# Patient Record
Sex: Female | Born: 1980 | Race: White | Hispanic: No | Marital: Married | Smoking: Never smoker
Health system: Southern US, Community
[De-identification: ages and names within clinical notes are randomized; demographics above are authoritative.]

## PROBLEM LIST (undated history)

## (undated) ENCOUNTER — Inpatient Hospital Stay (HOSPITAL_COMMUNITY): Payer: Self-pay

## (undated) DIAGNOSIS — O09219 Supervision of pregnancy with history of pre-term labor, unspecified trimester: Secondary | ICD-10-CM

## (undated) DIAGNOSIS — I10 Essential (primary) hypertension: Secondary | ICD-10-CM

## (undated) DIAGNOSIS — O09212 Supervision of pregnancy with history of pre-term labor, second trimester: Secondary | ICD-10-CM

## (undated) DIAGNOSIS — Z789 Other specified health status: Secondary | ICD-10-CM

## (undated) DIAGNOSIS — O3432 Maternal care for cervical incompetence, second trimester: Secondary | ICD-10-CM

## (undated) DIAGNOSIS — O34219 Maternal care for unspecified type scar from previous cesarean delivery: Secondary | ICD-10-CM

## (undated) HISTORY — PX: NO PAST SURGERIES: SHX2092

---
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
## 2013-04-25 LAB — OB RESULTS CONSOLE HIV ANTIBODY (ROUTINE TESTING): HIV: NONREACTIVE

## 2013-04-25 LAB — OB RESULTS CONSOLE HGB/HCT, BLOOD
HCT: 33 %
Hemoglobin: 11.3 g/dL

## 2013-04-25 LAB — OB RESULTS CONSOLE RUBELLA ANTIBODY, IGM: Rubella: IMMUNE

## 2013-04-25 LAB — OB RESULTS CONSOLE RPR: RPR: NONREACTIVE

## 2013-04-25 LAB — OB RESULTS CONSOLE ANTIBODY SCREEN: ANTIBODY SCREEN: NEGATIVE

## 2013-04-25 LAB — OB RESULTS CONSOLE ABO/RH: RH TYPE: POSITIVE

## 2013-04-25 LAB — OB RESULTS CONSOLE HEPATITIS B SURFACE ANTIGEN: HEP B S AG: NEGATIVE

## 2013-05-01 LAB — OB RESULTS CONSOLE GC/CHLAMYDIA
CHLAMYDIA, DNA PROBE: NEGATIVE
GC PROBE AMP, GENITAL: NEGATIVE

## 2013-08-20 ENCOUNTER — Encounter (HOSPITAL_COMMUNITY): Payer: Self-pay | Admitting: *Deleted

## 2013-08-20 ENCOUNTER — Encounter (HOSPITAL_COMMUNITY): Payer: BC Managed Care – PPO

## 2013-08-20 ENCOUNTER — Inpatient Hospital Stay (HOSPITAL_COMMUNITY)
Admission: AD | Admit: 2013-08-20 | Discharge: 2013-09-03 | DRG: 765 | Disposition: A | Discharge status: Home / Self Care | Payer: BC Managed Care – PPO | Source: Ambulatory Visit | Attending: Obstetrics and Gynecology | Admitting: Obstetrics and Gynecology

## 2013-08-20 DIAGNOSIS — O30009 Twin pregnancy, unspecified number of placenta and unspecified number of amniotic sacs, unspecified trimester: Secondary | ICD-10-CM | POA: Diagnosis present

## 2013-08-20 DIAGNOSIS — O99892 Other specified diseases and conditions complicating childbirth: Secondary | ICD-10-CM | POA: Diagnosis present

## 2013-08-20 DIAGNOSIS — O3090 Multiple gestation, unspecified, unspecified trimester: Secondary | ICD-10-CM | POA: Diagnosis present

## 2013-08-20 DIAGNOSIS — O4290 Premature rupture of membranes, unspecified as to length of time between rupture and onset of labor, unspecified weeks of gestation: Secondary | ICD-10-CM | POA: Diagnosis present

## 2013-08-20 DIAGNOSIS — O3430 Maternal care for cervical incompetence, unspecified trimester: Secondary | ICD-10-CM | POA: Diagnosis present

## 2013-08-20 DIAGNOSIS — Z22330 Carrier of Group B streptococcus: Secondary | ICD-10-CM

## 2013-08-20 DIAGNOSIS — O30049 Twin pregnancy, dichorionic/diamniotic, unspecified trimester: Secondary | ICD-10-CM | POA: Diagnosis not present

## 2013-08-20 DIAGNOSIS — IMO0001 Reserved for inherently not codable concepts without codable children: Secondary | ICD-10-CM

## 2013-08-20 DIAGNOSIS — O1002 Pre-existing essential hypertension complicating childbirth: Secondary | ICD-10-CM | POA: Diagnosis present

## 2013-08-20 DIAGNOSIS — O26879 Cervical shortening, unspecified trimester: Secondary | ICD-10-CM | POA: Diagnosis present

## 2013-08-20 DIAGNOSIS — O9989 Other specified diseases and conditions complicating pregnancy, childbirth and the puerperium: Secondary | ICD-10-CM

## 2013-08-20 DIAGNOSIS — O4700 False labor before 37 completed weeks of gestation, unspecified trimester: Secondary | ICD-10-CM | POA: Diagnosis present

## 2013-08-20 DIAGNOSIS — O329XX0 Maternal care for malpresentation of fetus, unspecified, not applicable or unspecified: Secondary | ICD-10-CM

## 2013-08-20 HISTORY — DX: Essential (primary) hypertension: I10

## 2013-08-20 HISTORY — DX: Other specified health status: Z78.9

## 2013-08-20 LAB — CBC
HCT: 37.1 % (ref 36.0–46.0)
HEMOGLOBIN: 12.3 g/dL (ref 12.0–15.0)
MCH: 31.9 pg (ref 26.0–34.0)
MCHC: 33.2 g/dL (ref 30.0–36.0)
MCV: 96.1 fL (ref 78.0–100.0)
Platelets: 209 10*3/uL (ref 150–400)
RBC: 3.86 MIL/uL — AB (ref 3.87–5.11)
RDW: 13.7 % (ref 11.5–15.5)
WBC: 18.6 10*3/uL — ABNORMAL HIGH (ref 4.0–10.5)

## 2013-08-20 MED ORDER — METHYLDOPA 250 MG PO TABS
250.0000 mg | ORAL_TABLET | Freq: Two times a day (BID) | ORAL | Status: DC
  Administered 2013-08-20 – 2013-08-31 (×22): 250 mg via ORAL
  Filled 2013-08-20 (×24): qty 1

## 2013-08-20 MED ORDER — PRENATAL MULTIVITAMIN CH
1.0000 | ORAL_TABLET | Freq: Two times a day (BID) | ORAL | Status: DC
  Administered 2013-08-20 – 2013-08-31 (×22): 1 via ORAL
  Filled 2013-08-20 (×22): qty 1

## 2013-08-20 MED ORDER — PRENATAL 27-0.8 MG PO TABS
1.0000 | ORAL_TABLET | Freq: Two times a day (BID) | ORAL | Status: DC
Start: 2013-08-20 — End: 2013-08-20
  Filled 2013-08-20 (×2): qty 1

## 2013-08-20 MED ORDER — ACETAMINOPHEN 325 MG PO TABS
650.0000 mg | ORAL_TABLET | ORAL | Status: DC | PRN
  Administered 2013-08-30: 650 mg via ORAL
  Filled 2013-08-20: qty 2

## 2013-08-20 MED ORDER — BETAMETHASONE SOD PHOS & ACET 6 (3-3) MG/ML IJ SUSP
12.0000 mg | Freq: Once | INTRAMUSCULAR | Status: AC
  Administered 2013-08-20: 12 mg via INTRAMUSCULAR
  Filled 2013-08-20: qty 2

## 2013-08-20 MED ORDER — CALCIUM CARBONATE ANTACID 500 MG PO CHEW: 2.0000 | CHEWABLE_TABLET | ORAL | Status: DC | PRN

## 2013-08-20 MED ORDER — MAGNESIUM OXIDE 400 (241.3 MG) MG PO TABS
200.0000 mg | ORAL_TABLET | Freq: Two times a day (BID) | ORAL | Status: DC
  Administered 2013-08-20 – 2013-08-23 (×7): 200 mg via ORAL
  Administered 2013-08-24: 10:00:00 via ORAL
  Administered 2013-08-24 – 2013-08-27 (×6): 200 mg via ORAL
  Administered 2013-08-27 – 2013-08-28 (×2): via ORAL
  Administered 2013-08-28 – 2013-08-30 (×5): 200 mg via ORAL
  Filled 2013-08-20 (×24): qty 0.5

## 2013-08-20 MED ORDER — MAGNESIUM OXIDE 250 MG PO TABS: 1.0000 | ORAL_TABLET | Freq: Two times a day (BID) | ORAL | Status: DC

## 2013-08-20 MED ORDER — NIFEDIPINE 10 MG PO CAPS
10.0000 mg | ORAL_CAPSULE | Freq: Four times a day (QID) | ORAL | Status: DC
  Administered 2013-08-20 – 2013-08-25 (×19): 10 mg via ORAL
  Filled 2013-08-20 (×19): qty 1

## 2013-08-20 MED ORDER — PRENATAL MULTIVITAMIN CH: 1.0000 | ORAL_TABLET | Freq: Every day | ORAL | Status: DC

## 2013-08-20 MED ORDER — NIFEDIPINE 10 MG PO CAPS
20.0000 mg | ORAL_CAPSULE | Freq: Once | ORAL | Status: AC
  Administered 2013-08-20: 20 mg via ORAL
  Filled 2013-08-20: qty 2

## 2013-08-20 MED ORDER — ZOLPIDEM TARTRATE 5 MG PO TABS
5.0000 mg | ORAL_TABLET | Freq: Every evening | ORAL | Status: DC | PRN
  Administered 2013-08-21 – 2013-08-31 (×9): 5 mg via ORAL
  Filled 2013-08-20 (×11): qty 1

## 2013-08-20 NOTE — H&P (Signed)
  OB ADMISSION/ HISTORY & PHYSICAL:  Admission Date: 08/20/2013 11:28 AM  Admit Diagnosis: [redacted] weeks gestation / twins -di-di / threatened preterm birth / short cervical length / chronic hypertension - stable  Nehemiah Settlenna Lori is a 33 y.o. female presenting for admit from the office with shortened cervical length with serial ultrasound this week. NO PTL signs.   Prenatal History: No obstetric history, G1   [facility]  Primary Ob Provider: Marlinda Mikeanya Bailey CNM and Dr Juliene PinaMody Prenatal course complicated by Di-Di twins / chronic hypertension stable on Aldomet / short cervix. No past surgeries on cervix.  Prenatal Labs: ABO, Rh:   A positive Antibody:  negative Rubella:   Immune RPR:   NR HBsAg:   Negative HIV:   NR GC-CHL: negative GTT: not done yet GBS:   not done yet  Medical / Surgical History :  Past medical history: chronic hypertension  Past surgical history: No past surgical history on file.  Family History: No family history on file.   Social History:  has no tobacco, alcohol, and drug history on file.   Allergies: Tetracycline Sulfa based antibiotics   Current Medications at time of admission:  Prenatal vitamin daily Magnesium 250 mg daily Aldomet 250mg  BID  Review of Systems: Active FM  Physical Exam:  VS: 98.3 - 96-18-116/82 General: alert and oriented, appears upset and concerned - tearful over admission Heart: RRR Lungs: Clear lung fields Abdomen: Gravid, soft and non-tender, non-distended / uterus: gravid and non-tender Extremities: no edema  Genitalia / VE:  posterior / soft / closed / thinning @ 60% / vtx / -4                          + LUSD                          + mucus discharge - wet prep few yeast / no BV  FHR: A-140 B-145, NST reactive A and B TOCO: no regular ctx or UI  Assessment: 26.[redacted] weeks gestation Twins - di-di  (cephalic and transverse) Chronic hypertension - stable on Aldomet (was  started prior to pt moving to [place], so didn't change) Threatened PTL / risk for preterm birth Short cervix  Plan:  Admit Inpatient observation and evalaution of uterine activity Completion of BMZ course (5/11, 5/12) NICU and MFM consult in AM.  Reviewed no role of progesterone (may harm) in twins. Will discuss bedrest v/s possible cervical pessary with MFM.        Initiate Magnesium sulphate for neuroprophylaxis if threatened preterm labor.   D/w pt and husband, voice understading, needs to inform her work that she will not be be returning until after delivery.

## 2013-08-20 NOTE — Progress Notes (Signed)
Pt off the monitor after reassurring FHR  

## 2013-08-21 ENCOUNTER — Inpatient Hospital Stay (HOSPITAL_COMMUNITY): Payer: BC Managed Care – PPO

## 2013-08-21 ENCOUNTER — Ambulatory Visit (HOSPITAL_COMMUNITY): Payer: BC Managed Care – PPO

## 2013-08-21 LAB — TYPE AND SCREEN
ABO/RH(D): A POS
ABO/RH(D): A POS
ANTIBODY SCREEN: POSITIVE
Antibody Screen: POSITIVE
DAT, IgG: POSITIVE
UNIT DIVISION: 0
Unit division: 0

## 2013-08-21 LAB — GLUCOSE, CAPILLARY: GLUCOSE-CAPILLARY: 121 mg/dL — AB (ref 70–99)

## 2013-08-21 MED ORDER — FAMOTIDINE 20 MG PO TABS
20.0000 mg | ORAL_TABLET | Freq: Every day | ORAL | Status: DC
  Administered 2013-08-21 – 2013-08-31 (×11): 20 mg via ORAL
  Filled 2013-08-21 (×11): qty 1

## 2013-08-21 MED ORDER — DOCUSATE SODIUM 100 MG PO CAPS
100.0000 mg | ORAL_CAPSULE | Freq: Two times a day (BID) | ORAL | Status: DC
  Administered 2013-08-21 – 2013-08-30 (×12): 100 mg via ORAL
  Filled 2013-08-21 (×19): qty 1

## 2013-08-21 NOTE — Consult Note (Addendum)
[facility]  [facility] Consult       08/21/2013  6:12 PM   I was asked by Dr. Juliene PinaMody to consult on this patient for possible preterm delivery.  I had the pleasure of meeting with Hannah [name] today.  She is a 33 y/o G1P0 who is pregnant with di-di female twins and is at 3926 and 4/[redacted] weeks gestation.  She was recently diagnosed with a shortned cervix and was [facility] for further observation.  She is to have an MFM consult today to help determine further management, but she is not actively laboring.  Both twins were well grown at the last growth scan and > 2 pounds according to [name].  She received BMZ on 5/11 and 5/12.  Her pregnancy has otherwise been uncomplicated.  [place] is married, [place], and is a middle Engineer, siteschool teacher.    I explained that the neonatal intensive care team would be present for the delivery and outlined the likely delivery room course for this baby including routine resuscitation and NRP-guided approaches to the treatment of respiratory distress. We discussed other common problems associated with prematurity including respiratory distress syndrome/CLD, apnea, feeding issues, temperature regulation, and infection risk.  We discussed IVH/PVL, ROP, and NEC and that these are complications associated with prematurity, and if her pregnancy were to last beyond [redacted] weeks gestation, these would be uncommon.    We discussed the average length of stay but I noted that the actual LOS would depend on the severity of problems encountered and response to treatments.  We discussed visitation policies and the resources available while her child is in the hospital.  We discussed the importance of good nutrition and various methods of providing nutrition (parenteral hyperalimentation, gavage feedings and/or oral feeding). We discussed the benefits of human milk. I encouraged breast feeding and pumping soon after  birth and outlined resources that are available to support breast feeding.  [name] plans to provide breast milk for her infants.    Thank you for involving us in the care of this patient. A member of our team will be available should the family have additional questions.  Time for consultation approximately 40 minutes.   _____________________ Electronically Signed By: Maryan CharLindsey Kaniya Trueheart, MD Neonatologist

## 2013-08-21 NOTE — Progress Notes (Signed)
Chief complaint: Cervical incompetence  Subjective: Patient notes active fetal movement x2, no vaginal bleeding, no leakage of fluid, no contractions. Patient tolerating bedrest with bathroom privileges appropriately. Patient interested in plan and maternal-fetal medicine recommendations. On complicated medical and prenatal history reviewed with patient. No prior cervical surgeries, spontaneous twins, in no medical conditions other than mild chronic hypertension.  Patient started on Procardia last night 10 mg every 6 hours. Patient has received betamethasone prophylaxis.  Objective: Filed Vitals:   08/21/13 0814  BP: 120/76  Pulse: 97  Temp: 98.6 F (37 C)  Resp: 18   General: Lying in bed, in no distress cardiovascular: Regular rate and rhythm Pulmonary: Clear to auscultation bilaterally Abdomen: Gravid, nontender, nondistended Lower extremity: Nontender, no edema GU: Deferred  Ultrasound: Formal report pending. Images reviewed closed cervical length of 0.5 cm with U-shaped funneling. Baby A vertex  Toco: slight irritibility, no contractions  Assessment and plan: 33 year old G1 at 26 weeks and 4 days with cervical funneling suggestive of cervical incompetence in the absence of cervical risk factors. Twin pregnancy  - Preterm cervical changes. Recommend continued bed rest at this point discussed with patient unclear if and when she will be able to be discharged from the hospital. Prophylactic measure such as betamethasone, bedrest and magnesium sulfate should she develop preterm labor discussed with patient. Again discussed with patient recommendation against cerclage and recommendation against progesterone. Pessary is one option which we will consider and have asked maternal-fetal medicine to consult on this issue. Procardia to decrease uterine irritability.  Chronic hypertension. Continue Aldomet  Prophylaxis: Start Colace and Pepcid continue prenatal vitamin and start SCDs  Mode  of delivery: Likely C-section given prematurity  Disposition: Inpatient

## 2013-08-22 NOTE — Progress Notes (Signed)
Assisted by CNA with washing her hair

## 2013-08-22 NOTE — Progress Notes (Addendum)
ANTEPARTUM NOTE - Hospital Day 2  Subjective: Reports feeling well but teary Tolerating po intake / no nausea / no vomiting  BM today / Voiding QS No bleeding or LOF Fetal activity x2 Feeling occasional "bubbling" in lower abdomen, denies tightening or cramping      Objective: Vital signs: VS: Blood pressure 106/68, pulse 102, temperature 98.1 F (36.7 C), temperature source Oral, resp. rate 18, height 5\' 5"  (1.651 m), weight 71.714 kg (158 lb 1.6 oz), last menstrual period 02/16/2013.   Physical exam:        General appearance/behavior: teary, alert and oriented x3       Heart: RRR       Lungs CTA bilaterally       Abdomen: soft, non-tender, gravid       Extremities: no edema, Homans negative, SCDs placed       GU: deferred  Fetal Assessment:  FHR A: 145 bpm, moderate variability, +accels, no decels FHR B: 150 bpm, moderate variability, +accels, rare variable (audible distinction of 2 FHR per RN) TOCO: irregular irritability  Assessment: 26.[redacted] weeks gestation, didi twins Cervical incompetence with funneling NST reactive x2 Chronic HTN-stable S/p BMZ x2 doses  Plan:  Continue BR, Procardia, Aldomet, and fetal surveillance.  Place pessary per MFM.  Dr Juliene PinaMody updated with patient status / plan of care  Lawernce PittsMelanie N Bhambri MSN, CNM 08/22/2013, 12:21 PM  08/22/13 Pt seen at 5 pm. MD note- pt seen and examined, evaluated, agree with above note and plan. NST reactive x 2 for both twins.  Pessary reviewed and patient was counseled by MFM, wants to proceed, we are awaiting Pessary placement. Discharge planning not until we reviewed cervix stability and progress. Pt understands and agrees.  V.Karin Pinedo, MD

## 2013-08-22 NOTE — Progress Notes (Signed)
Monitors applied

## 2013-08-22 NOTE — Progress Notes (Signed)
Cardio off after reassurring FHR

## 2013-08-23 NOTE — Progress Notes (Signed)
This was my initial visit with Hannah [name] who reported that she is coping better today than she was yesterday.  We spoke about how to make this time of waiting as meaningful as possible and we talked about meditation and relaxation techniques to help her cope with her fears and worries.    We will continue to follow up with her as we are able, but please also page as needs arise.  Centex CorporationChaplain Katy Ching Rabideau Pager, [phone] 2:20 PM   08/23/13 1400  Clinical Encounter Type  Visited With Patient  Visit Type Spiritual support;Initial

## 2013-08-23 NOTE — Progress Notes (Signed)
Patient on bedpan to empty bladder.

## 2013-08-23 NOTE — Progress Notes (Signed)
ANTEPARTUM NOTE - Hospital Day 3 Subjective:  Reports feeling well, would like to know when pessary placement will be done.  Tolerating po intake / no nausea / no vomiting  BM today / Voiding QS  No bleeding or LOF  Fetal activity x2  Feeling occasional mild tightening in lower abdo.  Objective:  Vital signs:  VS: Blood pressure 115/68, pulse 88, temperature 98.5 F, temperature source Oral, resp. rate 18, height 5\' 5"  (1.651 m), weight 71.714 kg (158 lb 1.6 oz), last menstrual period 02/16/2013.  Physical exam:  General appearance/behavior: teary, alert and oriented x3  Abdomen: soft, non-tender, gravid  Extremities: no edema, Homans negative, SCDs placed  GU: deferred  Fetal Assessment:  FHR A: 140 bpm, moderate variability, +accels, no decels  FHR B: 150 bpm, moderate variability, +accels, no decels TOCO: rare irritability  Assessment:  26 6/[redacted] weeks gestation, didi twins  Cervical incompetence with funneling  NST reactive x2  Chronic HTN-stable  S/p BMZ x2 doses  Plan:  Continue BR, Procardia, Aldomet, and fetal surveillance.  Place pessary per MFM today.  MFM called to obtain information about estimated time of placement.  Dr Juliene PinaMody updated with patient status / plan of care  Genia DelMarie-Lyne Birda Didonato MD  08/23/2013 at 10:54 am

## 2013-08-24 LAB — SAMPLE TO BLOOD BANK

## 2013-08-24 NOTE — Progress Notes (Signed)
ANTEPARTUM NOTE - Hospital Day 4 Di-Di spontaneous twins 27 wks with cervical incompetence, cervical funneling  Subjective:  Reports feeling well, cramping and pelvic pressure symptoms are entirely resolved. No bleeding or LOF or increased vag discharge. Fetal activity x2. Normal bowel movements. Saw Chaplain yesterday..   Objective:  Vital signs: BP 109/73  Pulse 94  Temp(Src) 98.2 F (36.8 C) (Oral)  Resp 16  Ht 5\' 5"  (1.651 m)  Wt 158 lb 1.6 oz (71.714 kg)  BMI 26.31 kg/m2  LMP 02/16/2013  General :  alert and oriented x3, much more calm and not as teary.  Abdomen: soft, non-tender, gravid, relaxed, no UCs noted.  Extremities: no edema, Homans negative, SCDs placed  GU: sterile speculum exam noted normal vaginal discharge and no bleeding or mucus. Digital exam noted cervical length at 1-1.5 cm on external cervical exam only, lower segment distention felt but fetal parts above pelvic inlet.   Fetal Assessment:  FHR A: 140 bpm, moderate variability, +accels, no decels - reactive FHR B: 150 bpm, moderate variability, +accels, no decels - reactive TOCO: none  Assessment:  [redacted] weeks gestation, didi twins  Cervical incompetence with funneling  NST reactive x2  Chronic HTN-stable S/p BMZ x2 doses   Plan:  Continue BR, Procardia (10mg  qid), Aldomet (250mg  bid), and fetal surveillance.  Pessary ordered since not in stock per MFM. Will reassess if pessary v/s expectant management once pessary available depending on gestational age at that point.   Hilary Hertz-V.Modupe Shampine, MD

## 2013-08-25 MED ORDER — NIFEDIPINE 10 MG PO CAPS
10.0000 mg | ORAL_CAPSULE | Freq: Once | ORAL | Status: AC
  Administered 2013-08-25: 10 mg via ORAL
  Filled 2013-08-25: qty 1

## 2013-08-25 MED ORDER — NIFEDIPINE 10 MG PO CAPS
10.0000 mg | ORAL_CAPSULE | ORAL | Status: DC
  Administered 2013-08-25 – 2013-08-27 (×13): 10 mg via ORAL
  Filled 2013-08-25 (×13): qty 1

## 2013-08-25 NOTE — Progress Notes (Signed)
Pt off the monitor after reassuring FHR  

## 2013-08-25 NOTE — Progress Notes (Signed)
ANTEPARTUM NOTE - Hospital Day 5 Di-Di spontaneous twins 27.1 wks with cervical incompetence, cervical funneling CHTN, stable BTMZ 5/11, 5/12  Subjective:  Reports feeling well, but noted slight lower uterine cramping last night. No vag bleeding or pressure or fluid or increased vag discharge. Fetal activity x2.    Objective:  Vital signs: BP 119/76  Pulse 94  Temp(Src) 98.2 F (36.8 C) (Oral)  Resp 18  Ht 5\' 5"  (1.651 m)  Wt 158 lb 1.6 oz (71.714 kg)  BMI 26.31 kg/m2  LMP 02/16/2013   General :  A&O x3, comfortable in supine position  Abdomen: soft, non-tender, gravid, relaxed, no UCs noted.  Extremities: no edema, Homans negative, SCDs placed  GU: deferred  Fetal Assessment:  FHR A: 140s/ moderate variability, +accels, no decels - reactive FHR B: 140 s/ moderate variability, +accels, no decels - reactive TOCO: some irritability noted  Assessment:  27.[redacted] weeks gestation, didi twins  Cervical incompetence with funneling  NST reactive x2  Chronic HTN-stable S/p BMZ x2 doses   Plan:  Continue strict bedrest. Procardia - INCREASE to 10mg  every 4 hrs.  Aldomet (250mg  bid), and fetal surveillance.  Pessary from MFM awaited. Ordered through MFM dept here. Will reassess if pessary v/s expectant management once pessary available depending on gestational age at that point.   Hilary Hertz-V.Askia Hazelip, MD

## 2013-08-25 NOTE — Progress Notes (Signed)
Pt off the monitor after reassurrinng FHR

## 2013-08-26 NOTE — Progress Notes (Signed)
Ur chart review completed.  

## 2013-08-26 NOTE — Progress Notes (Signed)
Antenatal Nutrition Assessment:  Currently  27 4/[redacted] weeks gestation, with incompetent cervix, twins  Height  65 "  Weight 164 lbs  pre-pregnancy weight 145 lbs .  Pre-pregnancy  BMI 24.2  IBW 125 lbs Total weight gain 19.lbs Weight gain goals 37-54 lbs Estimated needs: 2000-2100 kcal/day, 85-95 grams protein/day, 2.3 liters fluid/day  Regular diet tolerated well, appetite good. Pt has been provide retail and snack menus. Is able to sit up to eat. Reports no concerns for GI motility Current diet prescription will provide for increased needs.  No abnormal nutrition related labs  Nutrition Dx: Increased nutrient needs r/t pregnancy and fetal growth requirements aeb [redacted] weeks gestation.  No educational needs assessed at this time.  Elisabeth CaraKatherine Deitrick Ferreri M.Odis LusterEd. R.D. LDN Neonatal Nutrition Support Specialist Pager [phone]

## 2013-08-26 NOTE — Progress Notes (Addendum)
Patient ID: Hannah [name], female   DOB: 01/30/1981, 33 y.o.   MRN: [id number] Cervical pessary ordered through [facility] office is here, showed to MFM, is appropriate. Will have pt see Dr Harlon FlorWhitaker on 5/19 am for cervical length sono and possible pessary placement if still indicated. Pt advised, agrees.  GBS needs tested, ordered. Glucola and RPR at 28 wks (09/02/16)  V.Juliene PinaMody, MD

## 2013-08-26 NOTE — Progress Notes (Signed)
S: Twins PTL Cervical insufficiency Di./DI @ 27 2/7 weeks BMZ complete  No complaints. Anxious regarding back order pessary (+) FM x 2 occ cramping  VS BP 104/70 98.3  Lungs clear to A Cor RRR Abd: gravid nontender Pad none Extr no edema or calf tenderness  Tracing baseline 145 ( A)/ 145 (B) (+) accels occ ctx (+) uterine irritability  IMP: Twins @ 27 2/7 weeks  Di/DI Cervical insufficiency P) cont inpt. Await pessary ? GBS cx

## 2013-08-27 ENCOUNTER — Inpatient Hospital Stay (HOSPITAL_COMMUNITY): Payer: BC Managed Care – PPO

## 2013-08-27 ENCOUNTER — Ambulatory Visit (HOSPITAL_COMMUNITY)
Admit: 2013-08-27 | Discharge: 2013-08-27 | Disposition: A | Discharge status: Home / Self Care | Payer: BC Managed Care – PPO | Attending: Obstetrics & Gynecology | Admitting: Obstetrics & Gynecology

## 2013-08-27 ENCOUNTER — Other Ambulatory Visit (HOSPITAL_COMMUNITY): Payer: BC Managed Care – PPO

## 2013-08-27 LAB — TYPE AND SCREEN
ABO/RH(D): A POS
Antibody Screen: POSITIVE
DAT, IGG: POSITIVE
Unit division: 0
Unit division: 0

## 2013-08-27 MED ORDER — MAGNESIUM SULFATE 40 G IN LACTATED RINGERS - SIMPLE
2.0000 g/h | INTRAVENOUS | Status: DC
  Filled 2013-08-27: qty 500

## 2013-08-27 MED ORDER — MAGNESIUM SULFATE BOLUS VIA INFUSION
4.0000 g | Freq: Once | INTRAVENOUS | Status: AC
  Administered 2013-08-27: 4 g via INTRAVENOUS
  Filled 2013-08-27: qty 500

## 2013-08-27 MED ORDER — INDOMETHACIN 25 MG PO CAPS
25.0000 mg | ORAL_CAPSULE | Freq: Four times a day (QID) | ORAL | Status: AC
  Administered 2013-08-27 – 2013-08-29 (×8): 25 mg via ORAL
  Filled 2013-08-27 (×8): qty 1

## 2013-08-27 MED ORDER — LACTATED RINGERS IV SOLN
INTRAVENOUS | Status: DC
  Administered 2013-08-27 – 2013-08-31 (×8): via INTRAVENOUS

## 2013-08-27 NOTE — Consult Note (Signed)
Maternal Fetal Medicine Consultation  Requesting Provider(s): Shea EvansVaishali Mody, MD  Reason for consultation: Shortened cervix, ? Candidate for pessary  HPI: Hannah [name] is a 33 yo G1P0 currently at 4938w3d with DC/DA twin gestation who is currently admitted for shortened cervix. The patient reports that a shortened cervix was noted on a routine clinic ultrasound and that she has otherwise been asymptomatic.  On previous exam on 5/13 was noted to have a cervical length of 7 mm with funneling almost to the external os. The completed a course of betamethasone on 5/11 and 5/12 and has been hospitalized on bedrest since that time.  She reports some "uterine irritability" but no cramping or uterine contractions.  She denies vaginal bleeding or leakage of fluid.  The patient reports a history of chronic hypertension and is currently on Aldomet.  Additionally, she was recently started on procardia for preterm contractions.  She is otherwise without complaints.  Both fetuses are active.  OB History: OB History   Grav Para Term Preterm Abortions TAB SAB Ect Mult Living   1               PMH:  Past Medical History  Diagnosis Date  . Medical history non-contributory   . Hypertension     PSH:  Past Surgical History  Procedure Laterality Date  . No past surgeries    . Mandible fracture surgery Bilateral 2000   Meds:  Current Facility-Administered Medications on File Prior to Encounter  Medication Dose Route Frequency Provider Last Rate Last Dose  . acetaminophen (TYLENOL) tablet 650 mg  650 mg Oral Q4H PRN Marlinda Mikeanya Bailey, CNM      . calcium carbonate (TUMS - dosed in mg elemental calcium) chewable tablet 400 mg of elemental calcium  2 tablet Oral Q4H PRN Marlinda Mikeanya Bailey, CNM      . docusate sodium (COLACE) capsule 100 mg  100 mg Oral BID Kelly A. Ernestina PennaFogleman, MD   100 mg at 08/26/13 2215  . famotidine (PEPCID) tablet 20 mg  20 mg Oral Daily Kelly A. Ernestina PennaFogleman, MD   20 mg at 08/27/13 1034  . lactated  ringers infusion   Intravenous Continuous Robley FriesVaishali R Mody, MD      . magnesium bolus via infusion 4 g  4 g Intravenous Once Robley FriesVaishali R Mody, MD      . magnesium oxide (MAG-OX) tablet 200 mg  200 mg Oral BID Robley FriesVaishali R Mody, MD      . magnesium sulfate 40 grams in LR 500 mL OB infusion  2 g/hr Intravenous Titrated Robley FriesVaishali R Mody, MD      . methyldopa (ALDOMET) tablet 250 mg  250 mg Oral BID Marlinda Mikeanya Bailey, CNM   250 mg at 08/27/13 1034  . NIFEdipine (PROCARDIA) capsule 10 mg  10 mg Oral 6 times per day Robley FriesVaishali R Mody, MD   10 mg at 08/27/13 0734  . prenatal multivitamin tablet 1 tablet  1 tablet Oral BID Robley FriesVaishali R Mody, MD   1 tablet at 08/27/13 1034  . zolpidem (AMBIEN) tablet 5 mg  5 mg Oral QHS PRN Marlinda Mikeanya Bailey, CNM   5 mg at 08/26/13 0344   Current Outpatient Prescriptions on File Prior to Encounter  Medication Sig Dispense Refill  . Magnesium Oxide 250 MG TABS Take 1 tablet by mouth 2 (two) times daily.      . methyldopa (ALDOMET) 250 MG tablet Take 250 mg by mouth 2 (two) times daily.      . Prenatal Vit-Fe Fumarate-FA (  MULTIVITAMIN-PRENATAL) 27-0.8 MG TABS tablet Take 1 tablet by mouth 2 (two) times daily.       Allergies:  Allergies  Allergen Reactions  . Tetracyclines & Related Other (See Comments)    Severe stomach cramps  . Sulfa Antibiotics Rash    After sun exposure   FH: Soc:  History   Social History  . Marital Status: Married    Spouse Name: N/A    Number of Children: N/A  . Years of Education: N/A   Occupational History  . Not on file.   Social History Main Topics  . Smoking status: Never Smoker   . Smokeless tobacco: Not on file  . Alcohol Use: Yes     Comment: occassioanl social drinker, not since pregnant  . Drug Use: No  . Sexual Activity: Not Currently   Other Topics Concern  . Not on file   Social History Narrative  . No narrative on file    Review of Systems: no vaginal bleeding or cramping/contractions, no LOF, no nausea/vomiting. All other  systems reviewed and are negative.  PNL: A -- -- Negative -- Nonreactive -- (01/15 0000)   PE:  GEN: well-appearing female ABD: gravid, NT Speculum exam - fetal membranes ballooning into the vagina   Ultrasound: DC/DA twin gestation at 10381w3d Limited ultrasound performed for cervical length Twin A (presenting twin) - cephalic Twin B - transverse with fetal head on the maternal left  TVUS - the cervix is open with membranes visible in the vagina  Ultrasound findings were discussed with the patient.  Based on ultrasound and physical exam findings, she is not a candidate for pessary and at significant risk for PROM.  Labs: CBC    Component Value Date/Time   WBC 18.6* 08/20/2013 2105   RBC 3.86* 08/20/2013 2105   HGB 12.3 08/20/2013 2105   HGB 11.3 04/25/2013   HCT 37.1 08/20/2013 2105   HCT 33 04/25/2013   PLT 209 08/20/2013 2105   MCV 96.1 08/20/2013 2105   MCH 31.9 08/20/2013 2105   MCHC 33.2 08/20/2013 2105   RDW 13.7 08/20/2013 2105     A/P: 1) DC/DA twin gestation at 3481w3d         2) Shortened cervix - now open with fetal membranes into the vagina (hour-glassing membranes); not a candidate for trial of pessary. Completed betamethasone on 5/11 and 5/12         3) Chronic hypertension - well controlled on Aldomet/ Procardia  Recommendations: 1) Hour-glassing membranes now noted - this is a significant clinical change, would recommend a 12 hr course of Magnesium sulfate for neuroprotection.  Fortunately, the presenting twin is cephalic at this time.  I would anticipate that the patient will likely experience PROM within the next several days.  If this occurs, would recommend a course of latency antibiotics and continued in-house observation with at least daily fetal strips. 2) There is likely no benefit for continued cervical length surveillance.  Would recommend serial growth ultrasounds while hospitalized and limited ultrasounds as clinically indicated to check fetal  presentation.  If Twin B remains in a non-cephalic presentation, would recommend cesarean delivery given anticipated fetal weights at this gestation.  Findings and recommendations were discussed with Dr. Juliene PinaMody.   Thank you for the opportunity to be a part of the care of Hannah [name]. Please contact our office if we can be of further assistance.   I spent approximately 30 minutes with this patient with over 50% of time spent in  face-to-face counseling.  Alpha Gula, MD Maternal Fetal Medicine

## 2013-08-27 NOTE — Progress Notes (Signed)
S: Twins PTL Cervical insufficiency Di./DI @ 27 3/7 weeks BMZ complete  No complaints. (+) FM x 2 occ cramping, no bleeding/leaking/ increased vaginal discharge  VS BP 104/70 98.3  Lungs clear to A CV RRR Abd: gravid nontender Pad no discharge Extr no edema or calf tenderness  Tracing baseline 145 ( A)/ 145 (B) (+) accels occ ctx  IMP: Twins @ 27 3/7 weeks  Di/DI Cervical insufficiency P) Pessary today.  GBS cx ordered.       1hr GTT, RPR ordered for 28 wks.

## 2013-08-28 LAB — CULTURE, BETA STREP (GROUP B ONLY)

## 2013-08-28 LAB — OB RESULTS CONSOLE GBS
GBS: POSITIVE
GBS: POSITIVE

## 2013-08-28 NOTE — Progress Notes (Signed)
S: Twins  PTL  Cervical insufficiency  Di./DI @ 27 4/7 weeks  MgSO4 Neuro-protection/Indocin. BMZ complete  No complaints. (+) FM x 2  occ cramping, no bleeding/leaking/ increased vaginal discharge  VS BP 118/70, Pulse 86, RR 18, Temp 98.2 Lungs clear to A  CV RRR  Abd: gravid nontender  Pad no discharge  Extr no edema or calf tenderness  Tracing baseline 145 ( A)/ 145 (B) (+) accels  occ ctx  IMP: Twins @ 27 4/7 weeks.  Seen by MFM yesterday: Hour-glassing membranes, not a candidate for pessary.  Di/DI  Cervical insufficiency  P) Keep in Trendelenburg.  MgSO4 Neuroprotection x 24 hrs finishing today at noon.  On Indocin x 48 hrs.  NST qshift.   GBS cx ordered.  1hr GTT, RPR ordered for 28 wks.   Genia DelMarie-Lyne Henya Aguallo MD  08/28/2013 at 9:11 am

## 2013-08-28 NOTE — Progress Notes (Signed)
Hannah [name] was in much better spirits this afternoon.  She is adjusting to her new positioning and looking forward to some visits from friends this afternoon.  She was very appreciative of the support.  [employer] CorporationChaplain Katy Emrey Thornley Pager, [phone] 3:01 PM   08/28/13 1400  Clinical Encounter Type  Visited With Patient  Visit Type Follow-up

## 2013-08-28 NOTE — Progress Notes (Signed)
Chaplain at bedside for patient support.

## 2013-08-28 NOTE — Progress Notes (Signed)
I received a referral from pt's RN.  Tobi Bastosnna learned yesterday that she is closer to delivery and today, she has had more restrictions on her position.  She was frustrated with her limited independence and scared for her babies.  We spent some time doing some relaxation techniques as well as some time arranging her pillows and things to optimize her comfort and access to what she most needs while she is in bed.    She was feeling somewhat better when I left and she was grateful for the additional support.  [employer] CorporationChaplain Katy Equan Cogbill Pager, [phone] 11:30 a.m.

## 2013-08-29 MED ORDER — AZITHROMYCIN 1 G PO PACK
1.0000 g | PACK | Freq: Once | ORAL | Status: AC
  Administered 2013-08-29: 1 g via ORAL
  Filled 2013-08-29: qty 1

## 2013-08-29 MED ORDER — AMOXICILLIN 500 MG PO CAPS
500.0000 mg | ORAL_CAPSULE | Freq: Three times a day (TID) | ORAL | Status: DC
  Administered 2013-08-31: 500 mg via ORAL
  Filled 2013-08-29 (×4): qty 1

## 2013-08-29 MED ORDER — NIFEDIPINE 10 MG PO CAPS
20.0000 mg | ORAL_CAPSULE | Freq: Four times a day (QID) | ORAL | Status: DC
  Administered 2013-08-29 – 2013-08-30 (×2): 20 mg via ORAL
  Filled 2013-08-29 (×2): qty 2

## 2013-08-29 MED ORDER — SODIUM CHLORIDE 0.9 % IV SOLN
2.0000 g | Freq: Four times a day (QID) | INTRAVENOUS | Status: AC
  Administered 2013-08-29 – 2013-08-31 (×8): 2 g via INTRAVENOUS
  Filled 2013-08-29 (×8): qty 2000

## 2013-08-29 NOTE — Progress Notes (Signed)
Pt off the monitor after reassurring FHR  

## 2013-08-29 NOTE — Progress Notes (Signed)
Pt off the monitor after reassuring FHR  

## 2013-08-29 NOTE — Progress Notes (Signed)
08/29/13 1520  Clinical Encounter Type  Visited With Patient  Visit Type Follow-up;Spiritual support;Social support  Referral From Chaplain;Nurse  Spiritual Encounters  Spiritual Needs Emotional   Hannah [name] was in good spirits on this follow-up visit.  She describes herself as a people person who really wants support, and she was able to take in and process affirmation.  Per Hannah BastosAnna, she would like to have a chaplain called when she goes into labor--for pt/family support as context allows.  We talked about her coping tools and how they are "transferable skills" for parenting, as well as ways that [place] can support her and her family [facility]  Her dad is flying in from [place] tomorrow.  (Her mom died when she was a teenager.)  will follow, but please page as needs arise and/or pt's situation changes significantly.  Thank you!  [address]Chaplain Jericho Cieslik Montclair State UniversityLundeen, South DakotaMDiv [phone]

## 2013-08-29 NOTE — Progress Notes (Signed)
  Cervical insufficiency with hourglassing membranes Di./DI @ 27 5/7 weeks  MgSO4 Neuro-protection/Indocin.  S:  No complaints. (+) FM x 2  occ cramping, no bleeding/leaking/ increased vaginal discharge  No contractions reported. No CP or SOB  O: BP 118/78  Pulse 92  Temp(Src) 98 F (36.7 C) (Oral)  Resp 18  Ht 5\' 5"  (1.651 m)  Wt 74.39 kg (164 lb)  BMI 27.29 kg/m2  LMP 02/16/2013   WDWN WF NAD Lungs clear to A  CV RRR  Abd: gravid nontender  Pad no discharge  Extr no edema or calf tenderness  Neuro: non focal Skin: intact  Tracing baseline 145 ( A)/ 145 (B) (+) accels  occ ctx  Reactive NST x 3  IMP: Di/DI twins with concordant growth(5/11) Cervical insufficiency with hourglassing membranes. BMZ complete. GBS positive CHTN stable on Aldomet  P) Keep in Trendelenburg.  MgSO4 Neuroprotection x 24 hrs finished On Indocin x 48 hrs-dc today NST qshift.   .  1hr GTT, RPR ordered for 28 wks.  Given high risk of infection due to membrane status and GBS positive will treat as PPROM (discussed with MFM)

## 2013-08-30 LAB — TYPE AND SCREEN
ABO/RH(D): A POS
ANTIBODY SCREEN: POSITIVE
DAT, IGG: POSITIVE
UNIT DIVISION: 0
Unit division: 0

## 2013-08-30 LAB — URINALYSIS, ROUTINE W REFLEX MICROSCOPIC
BILIRUBIN URINE: NEGATIVE
Glucose, UA: NEGATIVE mg/dL
HGB URINE DIPSTICK: NEGATIVE
Ketones, ur: NEGATIVE mg/dL
Leukocytes, UA: NEGATIVE
Nitrite: NEGATIVE
Protein, ur: NEGATIVE mg/dL
Specific Gravity, Urine: 1.01 (ref 1.005–1.030)
UROBILINOGEN UA: 0.2 mg/dL (ref 0.0–1.0)
pH: 7.5 (ref 5.0–8.0)

## 2013-08-30 MED ORDER — LACTATED RINGERS IV BOLUS (SEPSIS)
500.0000 mL | Freq: Once | INTRAVENOUS | Status: AC
Start: 2013-08-30 — End: 2013-08-30
  Administered 2013-08-30: 500 mL via INTRAVENOUS

## 2013-08-30 MED ORDER — NIFEDIPINE 10 MG PO CAPS
10.0000 mg | ORAL_CAPSULE | ORAL | Status: DC | PRN
  Administered 2013-08-30: 10 mg via ORAL
  Filled 2013-08-30: qty 1

## 2013-08-30 MED ORDER — NIFEDIPINE 10 MG PO CAPS
10.0000 mg | ORAL_CAPSULE | ORAL | Status: DC
  Administered 2013-08-30 – 2013-08-31 (×5): 10 mg via ORAL
  Filled 2013-08-30 (×5): qty 1

## 2013-08-30 MED ORDER — METOCLOPRAMIDE HCL 5 MG/ML IJ SOLN
10.0000 mg | Freq: Once | INTRAMUSCULAR | Status: AC
  Administered 2013-08-30: 10 mg via INTRAVENOUS
  Filled 2013-08-30: qty 2

## 2013-08-30 NOTE — Progress Notes (Signed)
Hannah [name] was in good spirits when I visited with her although she reported that she'd had a rough night.  She was grateful to have her dad with her and grateful to be going into a three day weekend when her husband will have time off from work and will be able to be with her.  We talked some about what to expect from the NICU and discussed the support from [employer] and the support of fellow parents.  She was grateful for the visit and we will continue to follow up with her.  She is very receptive to care and encouragement and it seems to be helpful to her.  [address] [place] Pager, [phone] 1:06 PM   08/30/13 1300  Clinical Encounter Type  Visited With Patient  Visit Type Follow-up;Spiritual support

## 2013-08-30 NOTE — Progress Notes (Signed)
MD updated on pt c/o h/a and N/V approx 400cc emesis.  Reviewed with MD BP'.  Orders received for LR bolus and Reglan.

## 2013-08-30 NOTE — Progress Notes (Signed)
Pt off the cardio after reassurring FHR

## 2013-08-30 NOTE — Progress Notes (Signed)
Called to room. Patient unable to sleep in trandelenburg and raised to up level. She is feeling dizzy and HA. Patient also requested Remus Loffler which was given.

## 2013-08-30 NOTE — Progress Notes (Signed)
  Cervical insufficiency with hourglassing membranes Di./DI @ 27 6/7 weeks s/p BMZ/ MgSO4 Neuro-protection/Indocin. Resumed procardia 5/21, started antibiotic prophylaxis 5/21  S:  Patient awoke this morning with headache and emesis. Patient thinks this may have been related to steep Trendelenburg position and resuming Procardia. Patient still mild and bleeding nauseous this morning though does feel hungry. Mild headache. No vision changes. (+) FM x 2  occ cramping, no bleeding/leaking/ increased vaginal discharge  No contractions reported. No CP or SOB  O: BP 121/71  Pulse 97  Temp(Src) 98.1 F (36.7 C) (Oral)  Resp 18  Ht 5\' 5"  (1.651 m)  Wt 74.39 kg (164 lb)  BMI 27.29 kg/m2  LMP 02/16/2013    Lungs clear to A  CV RRR  Abd: gravid nontender  GU deferred  Extr no edema or calf tenderness  Neuro: non focal Skin: intact  Tracing baseline 145 ( A)/ 145 (B) (+) accels  occ ctx  Reactive NST x 3  IMP: Di/DI twins with concordant growth(5/11) Cervical insufficiency with hourglassing membranes. BMZ complete. GBS positive CHTN stable on Aldomet  P) Keep in Trendelenburg.  S/p MgSO4 Neuroprotection, betamethasone, Indocin  Per patient's preference we have resumed Procardia  NST qshift.   .  1hr GTT, RPR ordered for 28 wks. Will repeat growth ultrasound 2 weeks from the last, due 5/25  Given high risk of infection due to membrane status and GBS positive will treat as PPROM (discussed with MFM). Currently day 2 of antibiotics   Hannah [name] A. Lihanna Biever 08/30/2013 11:25 AM

## 2013-08-31 ENCOUNTER — Inpatient Hospital Stay (HOSPITAL_COMMUNITY): Payer: BC Managed Care – PPO | Admitting: Anesthesiology

## 2013-08-31 ENCOUNTER — Encounter (HOSPITAL_COMMUNITY): Payer: Self-pay | Admitting: Anesthesiology

## 2013-08-31 ENCOUNTER — Encounter (HOSPITAL_COMMUNITY): Payer: BC Managed Care – PPO | Admitting: Anesthesiology

## 2013-08-31 ENCOUNTER — Inpatient Hospital Stay (HOSPITAL_COMMUNITY): Payer: BC Managed Care – PPO

## 2013-08-31 ENCOUNTER — Encounter (HOSPITAL_COMMUNITY)
Admission: AD | Disposition: A | Discharge status: Home / Self Care | Payer: Self-pay | Source: Ambulatory Visit | Attending: Obstetrics & Gynecology

## 2013-08-31 DIAGNOSIS — O4290 Premature rupture of membranes, unspecified as to length of time between rupture and onset of labor, unspecified weeks of gestation: Secondary | ICD-10-CM | POA: Diagnosis not present

## 2013-08-31 DIAGNOSIS — O26879 Cervical shortening, unspecified trimester: Secondary | ICD-10-CM | POA: Diagnosis not present

## 2013-08-31 LAB — CBC WITH DIFFERENTIAL/PLATELET
Basophils Absolute: 0 10*3/uL (ref 0.0–0.1)
Basophils Relative: 0 % (ref 0–1)
Eosinophils Absolute: 0.1 10*3/uL (ref 0.0–0.7)
Eosinophils Relative: 1 % (ref 0–5)
HEMATOCRIT: 37.2 % (ref 36.0–46.0)
HEMOGLOBIN: 12.4 g/dL (ref 12.0–15.0)
LYMPHS ABS: 2.1 10*3/uL (ref 0.7–4.0)
Lymphocytes Relative: 14 % (ref 12–46)
MCH: 31.8 pg (ref 26.0–34.0)
MCHC: 33.3 g/dL (ref 30.0–36.0)
MCV: 95.4 fL (ref 78.0–100.0)
MONO ABS: 1.1 10*3/uL — AB (ref 0.1–1.0)
Monocytes Relative: 7 % (ref 3–12)
Neutro Abs: 11.4 10*3/uL — ABNORMAL HIGH (ref 1.7–7.7)
Neutrophils Relative %: 78 % — ABNORMAL HIGH (ref 43–77)
Platelets: 191 10*3/uL (ref 150–400)
RBC: 3.9 MIL/uL (ref 3.87–5.11)
RDW: 13.5 % (ref 11.5–15.5)
WBC: 14.7 10*3/uL — AB (ref 4.0–10.5)

## 2013-08-31 LAB — PREPARE RBC (CROSSMATCH)

## 2013-08-31 LAB — AMNISURE RUPTURE OF MEMBRANE (ROM) NOT AT ARMC: Amnisure ROM: POSITIVE

## 2013-08-31 SURGERY — Surgical Case
Anesthesia: Spinal

## 2013-08-31 MED ORDER — SIMETHICONE 80 MG PO CHEW
80.0000 mg | CHEWABLE_TABLET | Freq: Three times a day (TID) | ORAL | Status: DC
  Administered 2013-09-01 – 2013-09-03 (×7): 80 mg via ORAL
  Filled 2013-08-31 (×7): qty 1

## 2013-08-31 MED ORDER — ONDANSETRON HCL 4 MG/2ML IJ SOLN: 4.0000 mg | INTRAMUSCULAR | Status: DC | PRN

## 2013-08-31 MED ORDER — DIBUCAINE 1 % RE OINT: 1.0000 "application " | TOPICAL_OINTMENT | RECTAL | Status: DC | PRN

## 2013-08-31 MED ORDER — BUPIVACAINE HCL (PF) 0.25 % IJ SOLN
INTRAMUSCULAR | Status: AC
  Filled 2013-08-31: qty 30

## 2013-08-31 MED ORDER — CEFAZOLIN SODIUM-DEXTROSE 2-3 GM-% IV SOLR
INTRAVENOUS | Status: DC | PRN
  Administered 2013-08-31: 2 g via INTRAVENOUS

## 2013-08-31 MED ORDER — KETOROLAC TROMETHAMINE 30 MG/ML IJ SOLN
INTRAMUSCULAR | Status: AC
  Filled 2013-08-31: qty 1

## 2013-08-31 MED ORDER — PHENYLEPHRINE 8 MG IN D5W 100 ML (0.08MG/ML) PREMIX OPTIME
INJECTION | INTRAVENOUS | Status: AC
  Filled 2013-08-31: qty 100

## 2013-08-31 MED ORDER — ZOLPIDEM TARTRATE 5 MG PO TABS: 5.0000 mg | ORAL_TABLET | Freq: Every evening | ORAL | Status: DC | PRN

## 2013-08-31 MED ORDER — ONDANSETRON HCL 4 MG/2ML IJ SOLN
INTRAMUSCULAR | Status: DC | PRN
  Administered 2013-08-31: 4 mg via INTRAVENOUS

## 2013-08-31 MED ORDER — BISACODYL 10 MG RE SUPP: 10.0000 mg | Freq: Every day | RECTAL | Status: DC | PRN

## 2013-08-31 MED ORDER — OXYTOCIN 10 UNIT/ML IJ SOLN
40.0000 [IU] | INTRAVENOUS | Status: DC | PRN
  Administered 2013-08-31: 40 [IU] via INTRAVENOUS

## 2013-08-31 MED ORDER — NALBUPHINE HCL 10 MG/ML IJ SOLN: 5.0000 mg | INTRAMUSCULAR | Status: DC | PRN

## 2013-08-31 MED ORDER — WITCH HAZEL-GLYCERIN EX PADS: 1.0000 "application " | MEDICATED_PAD | CUTANEOUS | Status: DC | PRN

## 2013-08-31 MED ORDER — METOCLOPRAMIDE HCL 5 MG/ML IJ SOLN: 10.0000 mg | Freq: Three times a day (TID) | INTRAMUSCULAR | Status: DC | PRN

## 2013-08-31 MED ORDER — LACTATED RINGERS IV BOLUS (SEPSIS)
500.0000 mL | Freq: Once | INTRAVENOUS | Status: AC
  Administered 2013-08-31: 500 mL via INTRAVENOUS

## 2013-08-31 MED ORDER — CEFAZOLIN SODIUM-DEXTROSE 2-3 GM-% IV SOLR
INTRAVENOUS | Status: AC
  Filled 2013-08-31: qty 50

## 2013-08-31 MED ORDER — SODIUM CHLORIDE 0.9 % IJ SOLN: 3.0000 mL | INTRAMUSCULAR | Status: DC | PRN

## 2013-08-31 MED ORDER — ONDANSETRON HCL 4 MG PO TABS: 4.0000 mg | ORAL_TABLET | ORAL | Status: DC | PRN

## 2013-08-31 MED ORDER — KETOROLAC TROMETHAMINE 30 MG/ML IJ SOLN
30.0000 mg | Freq: Four times a day (QID) | INTRAMUSCULAR | Status: AC | PRN
  Administered 2013-08-31: 30 mg via INTRAVENOUS

## 2013-08-31 MED ORDER — OXYTOCIN 40 UNITS IN LACTATED RINGERS INFUSION - SIMPLE MED: 62.5000 mL/h | INTRAVENOUS | Status: AC

## 2013-08-31 MED ORDER — SODIUM CHLORIDE 0.9 % IJ SOLN
3.0000 mL | Freq: Two times a day (BID) | INTRAMUSCULAR | Status: DC
  Administered 2013-09-01: 3 mL via INTRAVENOUS

## 2013-08-31 MED ORDER — SODIUM CHLORIDE 0.9 % IV SOLN: 250.0000 mL | INTRAVENOUS | Status: DC

## 2013-08-31 MED ORDER — CITRIC ACID-SODIUM CITRATE 334-500 MG/5ML PO SOLN
ORAL | Status: AC
  Administered 2013-08-31: 30 mL
  Filled 2013-08-31: qty 15

## 2013-08-31 MED ORDER — MAGNESIUM SULFATE 40 G IN LACTATED RINGERS - SIMPLE
1.0000 g/h | INTRAVENOUS | Status: DC
  Filled 2013-08-31: qty 500

## 2013-08-31 MED ORDER — LANOLIN HYDROUS EX OINT: 1.0000 "application " | TOPICAL_OINTMENT | CUTANEOUS | Status: DC | PRN

## 2013-08-31 MED ORDER — MENTHOL 3 MG MT LOZG: 1.0000 | LOZENGE | OROMUCOSAL | Status: DC | PRN

## 2013-08-31 MED ORDER — SIMETHICONE 80 MG PO CHEW
80.0000 mg | CHEWABLE_TABLET | ORAL | Status: DC
  Administered 2013-09-01 – 2013-09-02 (×3): 80 mg via ORAL
  Filled 2013-08-31 (×3): qty 1

## 2013-08-31 MED ORDER — METHYLDOPA 250 MG PO TABS
250.0000 mg | ORAL_TABLET | Freq: Two times a day (BID) | ORAL | Status: DC
  Administered 2013-09-01 – 2013-09-03 (×6): 250 mg via ORAL
  Filled 2013-08-31 (×10): qty 1

## 2013-08-31 MED ORDER — SENNOSIDES-DOCUSATE SODIUM 8.6-50 MG PO TABS
2.0000 | ORAL_TABLET | ORAL | Status: DC
  Administered 2013-09-01 – 2013-09-02 (×3): 2 via ORAL
  Filled 2013-08-31 (×3): qty 2

## 2013-08-31 MED ORDER — MORPHINE SULFATE (PF) 0.5 MG/ML IJ SOLN
INTRAMUSCULAR | Status: DC | PRN
  Administered 2013-08-31: .15 mg via INTRATHECAL

## 2013-08-31 MED ORDER — TETANUS-DIPHTH-ACELL PERTUSSIS 5-2.5-18.5 LF-MCG/0.5 IM SUSP
0.5000 mL | Freq: Once | INTRAMUSCULAR | Status: AC
  Administered 2013-09-03: 0.5 mL via INTRAMUSCULAR
  Filled 2013-08-31: qty 0.5

## 2013-08-31 MED ORDER — FERROUS SULFATE 325 (65 FE) MG PO TABS
325.0000 mg | ORAL_TABLET | Freq: Two times a day (BID) | ORAL | Status: DC
  Administered 2013-09-01 (×2): 325 mg via ORAL
  Filled 2013-08-31 (×2): qty 1

## 2013-08-31 MED ORDER — ONDANSETRON HCL 4 MG/2ML IJ SOLN
INTRAMUSCULAR | Status: AC
  Filled 2013-08-31: qty 2

## 2013-08-31 MED ORDER — MAGNESIUM SULFATE BOLUS VIA INFUSION
4.0000 g | Freq: Once | INTRAVENOUS | Status: AC
  Administered 2013-08-31: 4 g via INTRAVENOUS
  Filled 2013-08-31: qty 500

## 2013-08-31 MED ORDER — MORPHINE SULFATE 0.5 MG/ML IJ SOLN
INTRAMUSCULAR | Status: AC
  Filled 2013-08-31: qty 10

## 2013-08-31 MED ORDER — BUPIVACAINE HCL (PF) 0.25 % IJ SOLN
INTRAMUSCULAR | Status: DC | PRN
  Administered 2013-08-31: 8 mL

## 2013-08-31 MED ORDER — FLEET ENEMA 7-19 GM/118ML RE ENEM: 1.0000 | ENEMA | Freq: Every day | RECTAL | Status: DC | PRN

## 2013-08-31 MED ORDER — DIPHENHYDRAMINE HCL 50 MG/ML IJ SOLN: 25.0000 mg | INTRAMUSCULAR | Status: DC | PRN

## 2013-08-31 MED ORDER — PRENATAL MULTIVITAMIN CH
1.0000 | ORAL_TABLET | Freq: Every day | ORAL | Status: DC
  Administered 2013-09-01 – 2013-09-03 (×3): 1 via ORAL
  Filled 2013-08-31 (×3): qty 1

## 2013-08-31 MED ORDER — ONDANSETRON HCL 4 MG/2ML IJ SOLN: 4.0000 mg | Freq: Three times a day (TID) | INTRAMUSCULAR | Status: DC | PRN

## 2013-08-31 MED ORDER — FENTANYL CITRATE 0.05 MG/ML IJ SOLN
INTRAMUSCULAR | Status: AC
  Filled 2013-08-31: qty 2

## 2013-08-31 MED ORDER — FENTANYL CITRATE 0.05 MG/ML IJ SOLN
INTRAMUSCULAR | Status: DC | PRN
  Administered 2013-08-31: 25 ug via INTRATHECAL

## 2013-08-31 MED ORDER — OXYTOCIN 10 UNIT/ML IJ SOLN
INTRAMUSCULAR | Status: AC
  Filled 2013-08-31: qty 4

## 2013-08-31 MED ORDER — FENTANYL CITRATE 0.05 MG/ML IJ SOLN: 25.0000 ug | INTRAMUSCULAR | Status: DC | PRN

## 2013-08-31 MED ORDER — DEXTROSE IN LACTATED RINGERS 5 % IV SOLN
INTRAVENOUS | Status: DC
  Administered 2013-09-01: 07:00:00 via INTRAVENOUS

## 2013-08-31 MED ORDER — DIPHENHYDRAMINE HCL 25 MG PO CAPS: 25.0000 mg | ORAL_CAPSULE | Freq: Four times a day (QID) | ORAL | Status: DC | PRN

## 2013-08-31 MED ORDER — SIMETHICONE 80 MG PO CHEW: 80.0000 mg | CHEWABLE_TABLET | ORAL | Status: DC | PRN

## 2013-08-31 MED ORDER — DIPHENHYDRAMINE HCL 50 MG/ML IJ SOLN: 12.5000 mg | INTRAMUSCULAR | Status: DC | PRN

## 2013-08-31 MED ORDER — KETOROLAC TROMETHAMINE 30 MG/ML IJ SOLN: 30.0000 mg | Freq: Four times a day (QID) | INTRAMUSCULAR | Status: AC | PRN

## 2013-08-31 MED ORDER — PHENYLEPHRINE 8 MG IN D5W 100 ML (0.08MG/ML) PREMIX OPTIME
INJECTION | INTRAVENOUS | Status: DC | PRN
  Administered 2013-08-31: 60 ug/min via INTRAVENOUS

## 2013-08-31 MED ORDER — NALOXONE HCL 0.4 MG/ML IJ SOLN: 0.4000 mg | INTRAMUSCULAR | Status: DC | PRN

## 2013-08-31 MED ORDER — SCOPOLAMINE 1 MG/3DAYS TD PT72
MEDICATED_PATCH | TRANSDERMAL | Status: AC
  Filled 2013-08-31: qty 1

## 2013-08-31 MED ORDER — DEXTROSE 5 % IV SOLN: 1.0000 ug/kg/h | INTRAVENOUS | Status: DC | PRN

## 2013-08-31 MED ORDER — DIPHENHYDRAMINE HCL 25 MG PO CAPS: 25.0000 mg | ORAL_CAPSULE | ORAL | Status: DC | PRN

## 2013-08-31 MED ORDER — OXYCODONE-ACETAMINOPHEN 5-325 MG PO TABS
1.0000 | ORAL_TABLET | ORAL | Status: DC | PRN
  Administered 2013-09-01 – 2013-09-03 (×12): 1 via ORAL
  Filled 2013-08-31 (×5): qty 1
  Filled 2013-08-31: qty 2
  Filled 2013-08-31 (×6): qty 1

## 2013-08-31 MED ORDER — IBUPROFEN 600 MG PO TABS
600.0000 mg | ORAL_TABLET | Freq: Four times a day (QID) | ORAL | Status: DC
  Administered 2013-09-01 – 2013-09-03 (×11): 600 mg via ORAL
  Filled 2013-08-31 (×11): qty 1

## 2013-08-31 MED ORDER — SCOPOLAMINE 1 MG/3DAYS TD PT72
1.0000 | MEDICATED_PATCH | Freq: Once | TRANSDERMAL | Status: DC
  Administered 2013-08-31: 1.5 mg via TRANSDERMAL

## 2013-08-31 MED ORDER — MEPERIDINE HCL 25 MG/ML IJ SOLN: 6.2500 mg | INTRAMUSCULAR | Status: DC | PRN

## 2013-08-31 MED ORDER — BUPIVACAINE IN DEXTROSE 0.75-8.25 % IT SOLN
INTRATHECAL | Status: DC | PRN
  Administered 2013-08-31: 1.6 mL via INTRATHECAL

## 2013-08-31 SURGICAL SUPPLY — 45 items
BARRIER ADHS 3X4 INTERCEED (GAUZE/BANDAGES/DRESSINGS) ×6 IMPLANT
BENZOIN TINCTURE PRP APPL 2/3 (GAUZE/BANDAGES/DRESSINGS) ×3 IMPLANT
CLAMP CORD UMBIL (MISCELLANEOUS) IMPLANT
CLOSURE WOUND 1/2 X4 (GAUZE/BANDAGES/DRESSINGS) ×1
CLOTH BEACON ORANGE TIMEOUT ST (SAFETY) ×3 IMPLANT
CONTAINER PREFILL 10% NBF 15ML (MISCELLANEOUS) IMPLANT
DRAPE LG THREE QUARTER DISP (DRAPES) IMPLANT
DRSG OPSITE POSTOP 4X10 (GAUZE/BANDAGES/DRESSINGS) ×3 IMPLANT
DURAPREP 26ML APPLICATOR (WOUND CARE) ×3 IMPLANT
ELECT REM PT RETURN 9FT ADLT (ELECTROSURGICAL) ×3
ELECTRODE REM PT RTRN 9FT ADLT (ELECTROSURGICAL) ×1 IMPLANT
EXTRACTOR VACUUM M CUP 4 TUBE (SUCTIONS) IMPLANT
EXTRACTOR VACUUM M CUP 4' TUBE (SUCTIONS)
GLOVE BIOGEL PI IND STRL 7.0 (GLOVE) ×1 IMPLANT
GLOVE BIOGEL PI INDICATOR 7.0 (GLOVE) ×2
GLOVE ECLIPSE 6.5 STRL STRAW (GLOVE) ×6 IMPLANT
GOWN STRL REUS W/TWL LRG LVL3 (GOWN DISPOSABLE) ×6 IMPLANT
KIT ABG SYR 3ML LUER SLIP (SYRINGE) IMPLANT
NEEDLE HYPO 25X1 1.5 SAFETY (NEEDLE) ×3 IMPLANT
NEEDLE HYPO 25X5/8 SAFETYGLIDE (NEEDLE) IMPLANT
NS IRRIG 1000ML POUR BTL (IV SOLUTION) ×3 IMPLANT
PACK C SECTION WH (CUSTOM PROCEDURE TRAY) ×3 IMPLANT
PAD OB MATERNITY 4.3X12.25 (PERSONAL CARE ITEMS) ×3 IMPLANT
RTRCTR C-SECT PINK 25CM LRG (MISCELLANEOUS) IMPLANT
STAPLER VISISTAT 35W (STAPLE) ×3 IMPLANT
STRIP CLOSURE SKIN 1/2X4 (GAUZE/BANDAGES/DRESSINGS) ×2 IMPLANT
SUT CHROMIC GUT AB #0 18 (SUTURE) IMPLANT
SUT MNCRL 0 VIOLET CTX 36 (SUTURE) ×3 IMPLANT
SUT MNCRL AB 4-0 PS2 18 (SUTURE) ×3 IMPLANT
SUT MON AB 3-0 SH 27 (SUTURE) ×2
SUT MON AB 3-0 SH27 (SUTURE) ×1 IMPLANT
SUT MON AB 4-0 PS1 27 (SUTURE) IMPLANT
SUT MONOCRYL 0 CTX 36 (SUTURE) ×6
SUT PLAIN 2 0 (SUTURE)
SUT PLAIN 2 0 XLH (SUTURE) IMPLANT
SUT PLAIN ABS 2-0 CT1 27XMFL (SUTURE) IMPLANT
SUT PROLENE 2 0 CT 30 (SUTURE) ×3 IMPLANT
SUT VIC AB 0 CT1 36 (SUTURE) ×6 IMPLANT
SUT VIC AB 2-0 CT1 27 (SUTURE) ×2
SUT VIC AB 2-0 CT1 TAPERPNT 27 (SUTURE) ×1 IMPLANT
SUT VIC AB 4-0 PS2 27 (SUTURE) IMPLANT
SYR CONTROL 10ML LL (SYRINGE) ×3 IMPLANT
TOWEL OR 17X24 6PK STRL BLUE (TOWEL DISPOSABLE) ×3 IMPLANT
TRAY FOLEY CATH 14FR (SET/KITS/TRAYS/PACK) IMPLANT
WATER STERILE IRR 1000ML POUR (IV SOLUTION) ×3 IMPLANT

## 2013-08-31 NOTE — Progress Notes (Signed)
Physician was informed for need to update plan of care for patient to undergo a primary cesarean section. Stated patient was an inpatient and there was no need for this per A. Sydnee Levans RN

## 2013-08-31 NOTE — Progress Notes (Signed)
Amniotic sac seen on SSE

## 2013-08-31 NOTE — Progress Notes (Signed)
Pt c/o leaking fluid.  Small amount of clear wetness noted on pad.  Slide obtained for fern.

## 2013-08-31 NOTE — Brief Op Note (Signed)
08/20/2013 - 08/31/2013  7:06 PM  PATIENT:  Hannah [name]  33 y.o. female  PRE-OPERATIVE DIAGNOSIS:  PPROM, Complete dilation, TwinA footling breech, IUP @ 28 weeks  POST-OPERATIVE DIAGNOSIS:  PPROM, footling breech Twin A, Complete Dilation, IUP @ 28 weeks  PROCEDURE:  Primary Vertical Cesarean section  SURGEON:  Surgeon(s) and Role:    * Sharde Gover Cathie Beams, MD - Primary  PHYSICIAN ASSISTANT:   ASSISTANTS: Donette Larry, CNM   ANESTHESIA:   spinal  EBL:   500 Findings: footling breech twin A, vtx twin B, fused anterior placenta x 2, nl tubes and ovaries BLOOD ADMINISTERED:none  DRAINS: none   LOCAL MEDICATIONS USED:  MARCAINE     SPECIMEN:  Source of Specimen:  placenta x 2  DISPOSITION OF SPECIMEN:  PATHOLOGY  COUNTS:  YES  TOURNIQUET:  * No tourniquets in log *  DICTATION: .Other Dictation: Dictation Number [id number]  PLAN OF CARE: Admit to inpatient   PATIENT DISPOSITION:  PACU - hemodynamically stable.   Delay start of Pharmacological VTE agent (>24hrs) due to surgical blood loss or risk of bleeding: no

## 2013-08-31 NOTE — Op Note (Deleted)
NAMNehemiah Settle:  [name], Hannah [name]              ACCOUNT NO.:  [id number]  MEDICAL RECORD NO.:  [id number]  LOCATION:  ULT                           [facility]  PHYSICIAN:  Maxie BetterSheronette Zayleigh Stroh, M.D.DATE OF BIRTH:  [birth year]  DATE OF PROCEDURE:  08/31/2013 DATE OF DISCHARGE:                                OPERATIVE REPORT   PREOPERATIVE DIAGNOSES:  Preterm premature rupture of membranes, malpresentation twin A (footling breech), complete dilatation, intrauterine gestation at 28 weeks, group B strep positive.  POSTOPERATIVE DIAGNOSES:  Footling breech presentation twin A, preterm premature rupture of membranes, complete dilatation, preterm labor, intrauterine gestation at 28 weeks.  PROCEDURE:  Primary vertical cesarean section.  ANESTHESIA:  Spinal.  SURGEON:  Maxie BetterSheronette Burlin Mcnair, MD.  ASSISTANDonette Larry:  Melanie Bhambri, CNM.  DESCRIPTION OF PROCEDURE:  Under adequate spinal anesthesia, the patient was placed in the supine position with a left lateral tilt.  She was sterilely prepped and draped in usual fashion.  An indwelling Foley catheter was sterilely placed.  A 0.25% Marcaine was injected along the planned Pfannenstiel skin incision.  Pfannenstiel skin incision was then made, carried down to the rectus fascia.  The rectus fascia was opened transversely.  Rectus fascia was then bluntly and sharply dissected off the rectus muscle in the superior and inferior fashion.  The rectus muscle was split in midline.  The parietal peritoneum was entered sharply and extended.  The vesicouterine peritoneum was opened transversely.  The bladder was then bluntly dissected off the lower uterine segment and displaced inferiorly with a bladder retractor.  The lower segment was narrow and therefore a low vertical incision was made and extended superiorly, probably about 1.5 cm into the upper muscularis of the uterus, but avoiding the anterior placenta.  With the cavity open, the artificial rupture of  membranes occurred.  A footling breech maneuver were done on twin A maternal right, followed by delivery of vertex in tow with subsequent rupture in delivery of a live female on the maternal left.  Apgars of 7 and 8 for twin A and 8 and 9 for twin B. Placenta x2 was delivered and sent to Pathology.  Uterine cavity was cleaned of debris.  Uterine incision was closed in 2 layers, the first layer with 0 Monocryl running locked stitch, second layer with imbricating 0 Monocryl suture, 3-0 Monocryl was then used for the small portion of the peritoneum on the serosa in the superior part of the incision.  Normal tubes and ovaries were noted bilaterally.  The abdomen was copiously irrigated and suctioned of debris.  Interceed was placed over the lower uterine segment.  The parietal peritoneum was closed with 2-0 Vicryl.  Rectus fascia was closed with 0 Vicryl x2.  The subcutaneous area was irrigated, small bleeders cauterized.  Interrupted 2-0 plain sutures placed and the skin approximated with Ethicon staples.  SPECIMENS:  Placenta x2 sent to Pathology.  ESTIMATED BLOOD LOSS:  500 mL.  INTRAOPERATIVE FLUID:  3200 mL.  URINE OUTPUT:  250 mL of clear yellow urine.  COUNTS:  Sponge and instrument counts x2 were correct.  COMPLICATIONS:  None.  The patient tolerated the procedure well and was transferred to the recovery in stable condition.  Both babies were transferred to the neonatal intensive care unit.      Maxie Better, M.D.     [place]/MEDQ  D:  08/31/2013  T:  08/31/2013  Job:  [id number]

## 2013-08-31 NOTE — Progress Notes (Addendum)
Called by RN regarding PPROM due to leakage of fluid noted by pt and fern (+)  O: SSE done: bulging membrane noted( clear amniotic fluid sac) and clear mucoid discharge only. No presenting part seen in sac. Digital exam deferred Pad. No fluid Crist Fat reviewed? Related to mucus vs slow leak  Sono in progress to evaluate presentation and cervical status.( mat right: footling breech, vtx on left). Foot in prolapsed membrane. Active fetuses  IMP; PTL on Magnesium due to increase ctx today : done for CP prophylaxis. Also already on PPROM antibiotics P) do amniosure. Given presentation of twin A, fine line as to whether to proceed with delivery now vs wait for more leakage while in the meantime get the full dose of magnesium for CP prophylaxis in the setting of breech. Last sono reports no measurable cervix .  Pt and husband informed of the dilemma and the possible consequence( emergency C/S) .will do type and crossmatch, notify anesthesia. NICU and put pt on clear liquid diet. Pt is in trendelenburg and advised to notify staff if increased leakage noted.  Await amniosure.    Amniosure positive. Pt and husband notified. Confer via phone with MFM. Concur with need for C/S given presentation and no measurable cervix( spec exam filled with amniotic sac). C/S due to malpresentation of twin A. Risk of C/s was reviewed with Pt.  Infection, bleeding, poss need for blood transfusion and its risk( HIV, hepatitis etc), poss need for C/S in the future, injury to surrounding organ structures. Consent signed earlier as was consent for blood transfusion .

## 2013-08-31 NOTE — Transfer of Care (Signed)
Immediate Anesthesia Transfer of Care Note  Patient: Hannah [name]  Procedure(s) Performed: Procedure(s): CESAREAN SECTION (N/A)  Patient Location: PACU  Anesthesia Type:Spinal  Level of Consciousness: awake  Airway & Oxygen Therapy: Patient Spontanous Breathing  Post-op Assessment: Report given to PACU RN and Post -op Vital signs reviewed and stable  Post vital signs: stable  Complications: No apparent anesthesia complications

## 2013-08-31 NOTE — Progress Notes (Signed)
TWINS IUP @ 28 weeks Preterm dilation, BMZ complete On  Antibiotics due to prolapsing membrane  O: VS 98.1 BP 114/74  breast soft nontender Abd: gravid soft nontender Pad none Pelvic deferred Extr" no edema or calf tenderness  Tracing (A) baseline 145 (B) baseline 145 (+) accel 10x 10 1-2 ctx /hour  IMP: Twin gestation w/ preterm dilation and PTL on procardia and antibiotics IUP @ 28 weeks P) cont with procardia. To complete antibiotic regimen. ucx pending cont inpt status

## 2013-08-31 NOTE — Progress Notes (Signed)
Ultrasound at bedside

## 2013-08-31 NOTE — Consult Note (Signed)
[facility]  Delivery Note:  C-section       08/31/2013  6:10 PM  I was called to the operating room at the request of the patient's obstetrician (Dr. Cherly Hensen) for a c-section at [redacted] weeks gestation due to PROM, preterm labor, and breech presentation.  PRENATAL HX:  This is a 33 y/o G1P0 who is pregnant with di-di female twins and is at 89 and 0/[redacted] weeks gestation. She was recently diagnosed with a shortned cervix and was admitted to the antepartum unit at [facility] on 08/20/13 for further observation.  She begain to leak fluid today and was found to be ruptured.  Baby A is footling breach and she began to have contractions, so she was taken to c-section.  She received BMZ on 5/11 and 5/12.   DELIVERY BABY A:  Infant was vigorous at delivery but CPAP applied at around 1 minute of age for grunting.  FiO2 titrated for age appropriate saturations with Max FiO2 60%.  She was weaned to 50% upon transport to the NICU.  APGARs 7 and 8.  Admitted to NICU for prematurity and respiratory support.    DELIVERY BABY B:   Infant was vigorous at delivery but CPAP applied at around 3 minutes of age for grunting.  FiO2 titrated for age appropriate saturations with Max FiO2 30%.  APGARs 8 and 9.  Admitted to NICU for prematurity and respiratory support.    _____________________ Electronically Signed By: Maryan Char, MD Neonatologist

## 2013-08-31 NOTE — Anesthesia Procedure Notes (Signed)
Spinal  Patient location during procedure: OR Start time: 08/31/2013 5:54 PM Staffing Anesthesiologist: Sherald Balbuena A. Performed by: anesthesiologist  Preanesthetic Checklist Completed: patient identified, site marked, surgical consent, pre-op evaluation, timeout performed, IV checked, risks and benefits discussed and monitors and equipment checked Spinal Block Patient position: right lateral decubitus Prep: site prepped and draped and DuraPrep Patient monitoring: heart rate, cardiac monitor, continuous pulse ox and blood pressure Approach: midline Location: L4-5 Injection technique: single-shot Needle Needle type: Sprotte  Needle gauge: 24 G Needle length: 9 cm Needle insertion depth: 6 cm Assessment Sensory level: T4 Additional Notes Patient tolerated procedure well. Adequate sensory level.

## 2013-08-31 NOTE — Anesthesia Preprocedure Evaluation (Signed)
Anesthesia Evaluation  Patient identified by MRN, date of birth, ID band Patient awake    Reviewed: Allergy & Precautions, H&P , NPO status , Patient's Chart, lab work & pertinent test results  Airway Mallampati: III TM Distance: >3 FB Neck ROM: Full    Dental no notable dental hx. (+) Teeth Intact   Pulmonary neg pulmonary ROS,  breath sounds clear to auscultation  Pulmonary exam normal       Cardiovascular hypertension, Pt. on medications Rhythm:Regular Rate:Normal     Neuro/Psych negative neurological ROS  negative psych ROS   GI/Hepatic negative GI ROS, Neg liver ROS,   Endo/Other  negative endocrine ROS  Renal/GU negative Renal ROS  negative genitourinary   Musculoskeletal negative musculoskeletal ROS (+)   Abdominal   Peds  Hematology negative hematology ROS (+)   Anesthesia Other Findings   Reproductive/Obstetrics (+) Pregnancy Twin gestation 57 weeks SROM                           Anesthesia Physical Anesthesia Plan  ASA: II and emergent  Anesthesia Plan: Spinal   Post-op Pain Management:    Induction:   Airway Management Planned: Natural Airway  Additional Equipment:   Intra-op Plan:   Post-operative Plan:   Informed Consent: I have reviewed the patients History and Physical, chart, labs and discussed the procedure including the risks, benefits and alternatives for the proposed anesthesia with the patient or authorized representative who has indicated his/her understanding and acceptance.     Plan Discussed with: Anesthesiologist, CRNA and Surgeon  Anesthesia Plan Comments:         Anesthesia Quick Evaluation

## 2013-08-31 NOTE — Op Note (Signed)
[name]:  Walter, Hannah [name]              ACCOUNT NO.:  [id number]  MEDICAL RECORD NO.:  [id number]  LOCATION:  ULT                           [facility]  PHYSICIAN:  Maxie BetterSheronette Tremane Spurgeon, M.D.DATE OF BIRTH:  May 09, 1980  DATE OF PROCEDURE:  08/31/2013 DATE OF DISCHARGE:                                OPERATIVE REPORT   PREOPERATIVE DIAGNOSES:  Preterm premature rupture of membranes, malpresentation twin A (footling breech), complete dilation, intrauterine gestation at 28 weeks, group B strep positive.  POSTOPERATIVE DIAGNOSES:  Footling breech presentation twin A, preterm premature rupture of membranes, complete dilation, preterm labor, intrauterine gestation at 28 weeks, group B strep positive.  PROCEDURE:  Primary low vertical cesarean section.  ANESTHESIA:  Spinal.  SURGEON:  Maxie BetterSheronette Regino Fournet, MD.  ASSISTANDonette Larry:  Melanie Bhambri, CNM.  DESCRIPTION OF PROCEDURE:  Under adequate spinal anesthesia, the patient was placed in the supine position with a left lateral tilt.  She was sterilely prepped and draped in usual fashion.  An indwelling Foley catheter was sterilely placed.  A 0.25% Marcaine was injected along the planned Pfannenstiel skin incision.  Pfannenstiel skin incision was then made, carried down to the rectus fascia.  The rectus fascia was opened transversely.  Rectus fascia was then bluntly and sharply dissected off the rectus muscle in the superior and inferior fashion.  The rectus muscle was split in midline.  The parietal peritoneum was entered sharply and extended.  The vesicouterine peritoneum was opened transversely.  The bladder was then bluntly dissected off the lower uterine segment and displaced inferiorly with a bladder retractor.  The lower segment was narrow and therefore a low vertical incision was made and extended superiorly, probably about 1.5 cm into the upper muscularis of the uterus, but avoiding the anterior placenta.  With the cavity open, the  artificial rupture of membranes occurred.  A footling breech maneuver were done on twin A (maternal right), followed by delivery of second twin from  Vertex position in tow with subsequent amniotic sac rupture with delivery of a live female from the maternal left.  Apgars of 7 and 8 for twin A and 8 and 9 for twin B at 1 and 5 minutes respectfully. Placenta x2 fused was delivered and sent to Pathology.  Uterine cavity was cleaned of debris.  Uterine incision was closed in 2 layers, the first layer with 0 Monocryl running locked stitch, second layer with imbricating 0 Monocryl suture, 3-0 Monocryl was then used for the small portion of the serosa in the superior part of the incision.  Normal tubes and ovaries were noted bilaterally.  The abdomen was copiously irrigated and suctioned of debris.  Interceed was placed over the lower uterine segment.  The parietal peritoneum was closed with 2-0 Vicryl.  Rectus fascia was closed with 0 Vicryl x2.  The subcutaneous area was irrigated, small bleeders cauterized.  Interrupted 2-0 plain sutures placed in the subcutaneous area and the skin approximated with Ethicon staples.  SPECIMENS:  Placenta x2 sent to Pathology.  ESTIMATED BLOOD LOSS:  500 mL.  INTRAOPERATIVE FLUID:  3200 mL.  URINE OUTPUT:  250 mL of clear yellow urine.  COUNTS:  Sponge and instrument counts x2 were correct.  COMPLICATIONS:  None.  The patient tolerated the procedure well and was transferred to the recovery in stable condition.  Both babies were transferred to the neonatal intensive care unit.      Maxie Better, M.D.     [place]/MEDQ  D:  08/31/2013  T:  08/31/2013  Job:  [id number]

## 2013-08-31 NOTE — Progress Notes (Signed)
Fern slide positive

## 2013-08-31 NOTE — Anesthesia Postprocedure Evaluation (Signed)
  Anesthesia Post-op Note  Patient: Hannah [name]  Procedure(s) Performed: Procedure(s): CESAREAN SECTION (N/A)    Last Vitals:  Filed Vitals:   08/31/13 1930  BP: 108/59  Pulse: 67  Temp:   Resp: 15     Patient is awake, responsive, moving her legs, and has signs of resolution of her numbness. Pain and nausea are reasonably well controlled. Vital signs are stable and clinically acceptable. Oxygen saturation is clinically acceptable. There are no apparent anesthetic complications at this time. Patient is ready for discharge.

## 2013-09-01 ENCOUNTER — Encounter (HOSPITAL_COMMUNITY): Payer: Self-pay | Admitting: *Deleted

## 2013-09-01 LAB — CBC
HEMATOCRIT: 33 % — AB (ref 36.0–46.0)
Hemoglobin: 11.2 g/dL — ABNORMAL LOW (ref 12.0–15.0)
MCH: 31.7 pg (ref 26.0–34.0)
MCHC: 33.9 g/dL (ref 30.0–36.0)
MCV: 93.5 fL (ref 78.0–100.0)
Platelets: 144 10*3/uL — ABNORMAL LOW (ref 150–400)
RBC: 3.53 MIL/uL — AB (ref 3.87–5.11)
RDW: 13.2 % (ref 11.5–15.5)
WBC: 16.3 10*3/uL — ABNORMAL HIGH (ref 4.0–10.5)

## 2013-09-01 NOTE — Anesthesia Postprocedure Evaluation (Signed)
Anesthesia Post Note  Patient: Hannah [name]  Procedure(s) Performed: Procedure(s) (LRB): CESAREAN SECTION (N/A)  Anesthesia type: Spinal  Patient location: Mother/Baby  Post pain: Pain level controlled  Post assessment: Post-op Vital signs reviewed  Last Vitals:  Filed Vitals:   09/01/13 1000  BP: 111/74  Pulse: 72  Temp: 36.7 C  Resp: 18    Post vital signs: Reviewed  Level of consciousness: awake  Complications: No apparent anesthesia complications

## 2013-09-01 NOTE — Plan of Care (Signed)
Problem: Consults Goal: Postpartum Patient Education (See Patient Education module for education specifics.)  Outcome: Progressing Postpartum teaching started.  Problem: Phase I Progression Outcomes Goal: Pain controlled with appropriate interventions Outcome: Completed/Met Date Met:  09/01/13 Pain controlled on PO Motrin at this time. Goal: OOB as tolerated unless otherwise ordered Outcome: Completed/Met Date Met:  09/01/13 Has been up done peri care and been down to NICU Twice and tolerated well. Goal: IS, TCDB as ordered Outcome: Completed/Met Date Met:  09/01/13 Can get I/S up to 2250 Goal: Initial discharge plan identified Outcome: Completed/Met Date Met:  09/01/13 Pain control Vaginal bleeding controlled VSS  Progress diet Voiding adequate amount

## 2013-09-01 NOTE — Progress Notes (Signed)
POD # 1  Subjective: Pt reports feeling well, stressful night, one baby in critical condition/ Pain controlled with Motrin and Percocet Tolerating po/ Foley d/c'd and voiding without problems/ No n/v/ Flatus present Activity: ad lib Bleeding is light Newborn info:  Information for the patient's newborn:  Knyla, Dwan [[id number]]  female Information for the patient's newborn:  Theresaann, Wiers [[id number]]  female Feeding: breastpumping   Objective:  VS:  Filed Vitals:   08/31/13 2230 09/01/13 0204 09/01/13 0706 09/01/13 1000  BP:  115/73 119/79 111/74  Pulse:  68 70 72  Temp:  98 F (36.7 C) 97.8 F (36.6 C) 98 F (36.7 C)  TempSrc:  Oral Oral Oral  Resp: 18 16 18 18   Height:      Weight:      SpO2: 98% 99% 97% 98%     I&O: Intake/Output     05/23 0701 - 05/24 0700 05/24 0701 - 05/25 0700   P.O. 860 220   I.V. (mL/kg) 4345.8 (58.4)    Total Intake(mL/kg) 5205.8 (70) 220 (3)   Urine (mL/kg/hr) 1200 (0.7) 1800 (6.2)   Blood 500 (0.3)    Total Output 1700 1800   Net +3505.8 -1580           Recent Labs  08/31/13 1545 09/01/13 0726  WBC 14.7* 16.3*  HGB 12.4 11.2*  HCT 37.2 33.0*  PLT 191 144*    Blood type: --/--/A POS (05/22 0708) Rubella: Immune (01/15 0000)    Physical Exam:  General: alert and cooperative CV: Regular rate and rhythm Resp: CTA bilaterally Abdomen: soft, nontender, normal bowel sounds Incision: healing well, no drainage, no erythema, no hernia, no seroma, no swelling, well approximated with staples, honeycomb dsg c/d/i Uterine Fundus: firm, below umbilicus, nontender Lochia: minimal Ext: extremities normal, atraumatic, no cyanosis or edema and Homans sign is negative, no sign of DVT    Assessment: POD # 1/ G1P0102/ S/P C/Section d/t SROM, footling breech, twins CHTN-stable Doing well  Plan: Ambulate Continue routine post op orders Continue breastpumping   Signed: Lawernce Pitts, MSN, CNM 09/01/2013, 10:54  AM

## 2013-09-01 NOTE — Addendum Note (Signed)
Addendum created 09/01/13 1023 by Graciela Husbands, CRNA   Modules edited: Notes Section   Notes Section:  File: [id number]

## 2013-09-01 NOTE — Lactation Note (Signed)
This note was copied from the chart of Hannah [name]. Lactation Consultation Note    Initial consult with this mom of NICU twins, now 22 hours post partum, and [redacted] weeks gestation. Mom has been pumping and collecting drops fo colostrum. I showed her how to hand express, and she collected about 1 ml. Mom has cone shaped, wide set breasts. She is very fair skinned, and her nipples get a purple tint with had expression, but they pink up after. I increased her to 27 flanges with a better fit. Mom very sad and emotional - she is upset she was not able to go closer to term. She was on bedrest in hospital for 2 weeks. Mom anxious to hold her babies skin to skin.  Lactation services reviewed with mom.  Mom is going to order a DEP from her insurance.  Patient Name: Hannah [name] Today's Date: 09/01/2013 Reason for consult: Initial assessment;NICU baby;Infant < 6lbs;Multiple gestation   Maternal Data Formula Feeding for Exclusion: Yes (baby in NICU) Infant to breast within first hour of birth: No Breastfeeding delayed due to:: Infant status Has patient been taught Hand Expression?: Yes Does the patient have breastfeeding experience prior to this delivery?: No  Feeding    LATCH Score/Interventions                      Lactation Tools Discussed/Used Tools: Pump Breast pump type: Double-Electric Breast Pump WIC Program: No Pump Review: Setup, frequency, and cleaning;Milk Storage;Other (comment) (premie setting, hand expression and review of NICU book on providing EBm for NICU) Initiated by:: bedside rn Date initiated:: 08/31/13   Consult Status Consult Status: Follow-up Date: 09/02/13 Follow-up type: In-patient    Trevious Rampey [name] 09/01/2013, 5:15 PM    

## 2013-09-02 ENCOUNTER — Encounter (HOSPITAL_COMMUNITY): Payer: Self-pay | Admitting: Obstetrics and Gynecology

## 2013-09-02 NOTE — Progress Notes (Signed)
Ur chart review completed.  

## 2013-09-02 NOTE — Progress Notes (Signed)
09/02/13 1500  Clinical Encounter Type  Visited With Patient and family together (husband Aaron Edelman, dad)  Visit Type Follow-up;Spiritual support;Social support  Spiritual Encounters  Spiritual Needs Emotional   Visited with Novalyn, Lajara, and Czarina's father in the NICU twice this this afternoon, providing spiritual and emotional support before and after they met with Dr Tora Kindred and Raelyn Ensign, NNP for updates on [place].  Klare and Aaron Edelman are buoyed up by the prospect of getting to [employer] and relieved that she is doing well.  Family is very receptive to chaplain presence and uses visits well to share and process feelings.  Continuing to follow for support.  Kennard, [place]

## 2013-09-02 NOTE — Progress Notes (Signed)
09/02/13 1100  Clinical Encounter Type  Visited With Patient  Visit Type Follow-up;Spiritual support;Social support  Spiritual Encounters  Spiritual Needs Emotional   Visited with Hannah [name] to offer support after her stressful and scary weekend.  She was tearful, working hard to process and integrate the huge changes in her life since the babies' delivery and her worries about them.  Hannah [name]'s father is visiting, and husband Hannah [name]'s father is coming, as well.  Today is Hannah [name]'s 36th birthday, and friends are bringing pizza and ice cream to the hospital to celebrate.  This support means a lot to the family.  Provided pastoral presence, reflective listening, and conversation about self-care.  Made a birthday card for Hannah [name], which staff from ante, NICU, and [facility] have signed.  Will continue to follow for support, but please page as needs arise:  [phone].  Thank you.  [address] [place], [place] [phone]

## 2013-09-02 NOTE — Progress Notes (Signed)
POD # 2  Subjective: Pt reports feeling well/ Pain controlled with Motrin and Percocet Tolerating po/Voiding without problems/ No n/v/ Flatus present, BM x1 No SOB/CP/dizziness Activity: ad lib Bleeding is light Newborn info:  Information for the patient's newborn:  Verenisse, Oconnor [[id number]]  female Information for the patient's newborn:  Caitrin, Pufahl [[id number]]  female Feeding: breastpumping   Objective: VS:  Filed Vitals:   09/01/13 1400 09/01/13 1846 09/01/13 2228 09/02/13 0620  BP: 112/73 109/71 106/64 101/57  Pulse: 73 74 72 64  Temp: 98.1 F (36.7 C) 97.8 F (36.6 C) 97.6 F (36.4 C) 97.9 F (36.6 C)  TempSrc: Oral Oral Oral Oral  Resp: 18 19 18 16   Height:      Weight:      SpO2: 100% 97% 98% 97%    I&O: Intake/Output     05/24 0701 - 05/25 0700 05/25 0701 - 05/26 0700   P.O. 580    I.V. (mL/kg)     Total Intake(mL/kg) 580 (7.8)    Urine (mL/kg/hr) 2450 (1.4)    Blood     Total Output 2450     Net -1870            LABS:  Recent Labs  08/31/13 1545 09/01/13 0726  WBC 14.7* 16.3*  HGB 12.4 11.2*  HCT 37.2 33.0*  PLT 191 144*    Blood type: --/--/A POS (05/22 0708) Rubella: Immune (01/15 0000)     Physical Exam:  General: alert and cooperative CV: Regular rate and rhythm Resp: CTA bilaterally Abdomen: soft, nontender, normal bowel sounds Uterine Fundus: firm, below umbilicus, nontender Incision: Covered with Tegaderm and honeycomb dressing; no drainage, edema, bruising, or erythema; well approximated with staples Lochia: minimal Ext: extremities normal, atraumatic, no cyanosis or edema and Homans sign is negative, no sign of DVT    Assessment/: POD # 2/ G1P0102/ S/P C/Section d/t PROM, footling breech, twins CHTN-stable  Doing well  Plan: Continue routine post op orders Watch BP, may need to decrease Aldomet dose Anticipate discharge home tomorrow   Signed: Lawernce Pitts, MSN, CNM 09/02/2013, 9:17 AM

## 2013-09-02 NOTE — Progress Notes (Signed)
Night Chaplain visit on referral from daytime chaplains. Ms Stair and her husband are heartened by the fact that reports on twin Bella Kennedy may be doing better in the NICU. Currently they are taken Allyson's condition hour to hour. Ms Heaslip reports that her faith community is sending prayer requests out to every possible location. Messages of spiritual care and support have arrived from as far away as [place] and [place]. Additionally, the couple is getting great emotional support from family and friends.  The Lepkowskis are active and practicing Christians, who do not divide their world view from their faith view. They deeply appreciate the holistic care given to them. They request continued spiritual care and support after Ms Boruch is discharged tomorrow, Sep 03, 2013, while babies Orpha Bur and Connye Burkitt remain in the Hospital. Ms Faraone is looking forward to going home after two plus weeks in the hospital, but she says she will be spending a great deal of time in [facility] attending to her twins.   The Lepkowskis greatly appreciate the excellent care given by the staff during this trying time, and for the spiritual care given them.  Will continue to follow for support, but please page as needs arise at any time: [phone].  Benjie Karvonen. Julene Rahn, DMin, MDiv Chaplain

## 2013-09-02 NOTE — Lactation Note (Signed)
This note was copied from the chart of Hannah [name]. Lactation Consultation Note  Patient Name: Hannah Kaianna Stumph Today's Date: 09/02/2013 Reason for consult: Follow-up assessment;NICU baby;Infant < 6lbs;Multiple gestation  Mom pumped 7 times yesterday every 3 hours; producing 1-2 tiny drops at present pumping that could not be collected for transport to NICU.  Worked with mom on hand expression; no colostrum obtained with hand expression after pumping and several minutes of trying to hand express. Mom has wide spaced breast - cone shaped, large nipples; using #27 flanges appropriate fit for nipple size.  Mom discouraged that she is not getting anything; LC gave encouragement and told her to continue pumping using hands-on pumping technique with hand expression at end of pumping session.  Encouraged to pump a minimum of 8x/day and to keep pumping log shown to mom in NICU booklet.  Did not discuss possible risks of low milk supply based on anatomy assessment appearance because it is too soon post-delivery; however, mom did report that her breasts enlarged during pregnancy and got tender..."that's how I knew I was pregnant."  Encouraged mom to keep in touch with NICU LC during infant's stay, encouraged STS in NICU, and discussed coming back to be seen for outpatient appointment.  Pump Rental Packet given to mom for consideration of renting a pump for 2 weeks until she gets her insurance pump.  Anticipated discharge tomorrow.    Consult Status Consult Status: Follow-up Date: 09/03/13 Follow-up type: In-patient    Ixel Boehning Walker Trayvon Trumbull 09/02/2013, 11:32 AM    

## 2013-09-03 DIAGNOSIS — O26879 Cervical shortening, unspecified trimester: Secondary | ICD-10-CM | POA: Diagnosis not present

## 2013-09-03 DIAGNOSIS — O4290 Premature rupture of membranes, unspecified as to length of time between rupture and onset of labor, unspecified weeks of gestation: Secondary | ICD-10-CM | POA: Diagnosis not present

## 2013-09-03 LAB — RPR

## 2013-09-03 LAB — TYPE AND SCREEN
ABO/RH(D): A POS
ANTIBODY SCREEN: POSITIVE
DAT, IgG: POSITIVE
Unit division: 0
Unit division: 0

## 2013-09-03 MED ORDER — IBUPROFEN 600 MG PO TABS: 600.0000 mg | ORAL_TABLET | Freq: Four times a day (QID) | ORAL | Status: DC

## 2013-09-03 MED ORDER — OXYCODONE-ACETAMINOPHEN 5-325 MG PO TABS: 1.0000 | ORAL_TABLET | ORAL | Status: DC | PRN

## 2013-09-03 NOTE — Plan of Care (Signed)
Problem: Discharge Progression Outcomes Goal: Remove staples per MD order Outcome: Not Applicable Date Met:  13/08/65 To be removed in MD office on May 28.

## 2013-09-03 NOTE — Progress Notes (Signed)
POSTOPERATIVE DAY # 3 S/P CS at 28 weeks for twins   S:         Reports feeling ok - tired             Tolerating po intake / no nausea / no vomiting / + flatus / + BM             Bleeding is spotting             Pain controlled with motrin and percocet             Up ad lib / ambulatory/ voiding QS  Newborns stable in NICU - twin B struggling in first 48 hours but stable overnight             Plans to BF - pumping every 3-4 hours  O:  VS: BP 118/67  Pulse 67  Temp(Src) 98.1 F (36.7 C) (Oral)  Resp 16  Ht 5\' 5"  (1.651 m)  Wt 74.39 kg (164 lb)  BMI 27.29 kg/m2  SpO2 100%  LMP 02/16/2013  Breastfeeding? Unknown   LABS:               Recent Labs  08/31/13 1545 09/01/13 0726  WBC 14.7* 16.3*  HGB 12.4 11.2*  PLT 191 144*               Bloodtype: --/--/A POS (05/22 0708)  Rubella: Immune (01/15 0000)                                  Physical Exam:             Alert and Oriented X3  Lungs: Clear and unlabored  Heart: regular rate and rhythm / no mumurs  Abdomen: soft, non-tender, non-distended active BS             Fundus: firm, non-tender, U-3             Dressing intact honeycomb              Incision:  approximated with staples / no erythema / no ecchymosis / no drainage  Perineum: intact  Lochia: light  Extremities: no edema, no calf pain or tenderness, negative Homans  A:        POD # 3 S/P CS at 28 weeks TWINS            Stable for DC home  P:        Routine postoperative care              DC home today - OV with TB Thursday for wound check / staple removal   Marlinda Mike CNM, MSN, [facility] 09/03/2013, 9:14 AM

## 2013-09-03 NOTE — Discharge Summary (Signed)
POSTOPERATIVE DISCHARGE SUMMARY:  Patient ID: Hannah [name] MRN: [id number] DOB/AGE: 07-01-1980 33 y.o.  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 Medical history non-contributory   . Hypertension     Past surgical history:  Past Surgical History  Procedure Laterality Date  . No past surgeries    . Mandible fracture surgery Bilateral 2000  . Cesarean section N/A 08/31/2013    Procedure: CESAREAN SECTION;  Surgeon: Serita Kyle, MD;  Location: [facility];  Service: Obstetrics;  Laterality: N/A;    Family History:  Family History  Problem Relation Age of Onset  . Alcohol abuse Father   . Arthritis Father   . Hyperlipidemia Father   . Hypertension Father   . Diabetes Maternal Aunt   . Depression Paternal Aunt   . Depression Paternal Uncle   . Diabetes Maternal Grandmother     Social History:  reports that she has never smoked. She does not have any smokeless tobacco history on file. She reports that she drinks alcohol. She reports that she does not use illicit drugs.  Allergies: Tetracyclines & related and Sulfa antibiotics   Current Medications at time of  admission:  Prior to Admission medications   Medication Sig Start Date End Date Taking? Authorizing Provider  ibuprofen (ADVIL,MOTRIN) 600 MG tablet Take 1 tablet (600 mg total) by mouth every 6 (six) hours. 09/03/13   Hannah Mike, CNM  Magnesium Oxide 250 MG TABS Take 1 tablet by mouth 2 (two) times daily.   Yes Historical Provider, MD  methyldopa (ALDOMET) 250 MG tablet Take 250 mg by mouth 2 (two) times daily.   Yes Historical Provider, MD  oxyCODONE-acetaminophen (PERCOCET/ROXICET) 5-325 MG per tablet Take 1-2 tablets by mouth every 4 (four) hours as needed for severe pain (moderate - severe pain). 09/03/13   Hannah Mike, CNM  Prenatal Vit-Fe Fumarate-FA (MULTIVITAMIN-PRENATAL) 27-0.8 MG TABS tablet Take 1 tablet by mouth 2 (two) times daily.   Yes Historical Provider, MD    Intrapartum Course:  Admit for short cervix on growth ultrasound - tocolytic / bedrest / BMZ course initiated. Progression to no measurable cervix with doming membranes despite inpatient efforts. PPROM with progression into preterm labor at 28 weeks with breech presentation - decision to proceed to immediate cesarean section.  Procedures: Cesarean section delivery on 08/31/2013 with delivery of  twin newborns by Dr Cherly Hensen See operative report for further details  APGAR (1 MIN): NICU admission   Hannah, [name] [[id number]]  7   Hannah, [name] [[id number]]  8   APGAR (5 MINS): NICU admission   Hannah, [name] [[id number]]  8   Hannah, [name] [[id number]]  9    Postoperative / postpartum course:  Uncomplicated with discharge on POD 3  Discharge Instructions:  Discharged Condition: stable  Activity: pelvic rest and postoperative restrictions x 2   Diet: routine  Medications:    Medication List         ibuprofen 600 MG tablet  Commonly known as:  ADVIL,MOTRIN  Take 1 tablet (600 mg total) by mouth every 6 (six) hours.     Magnesium Oxide 250 MG Tabs  Take 1 tablet by  mouth 2 (two) times daily.     methyldopa 250 MG tablet  Commonly known as:  ALDOMET  Take 250 mg by mouth 2 (two) times daily.     multivitamin-prenatal 27-0.8 MG Tabs tablet  Take 1 tablet by mouth 2 (two) times daily.     oxyCODONE-acetaminophen 5-325 MG per tablet  Commonly known as:  PERCOCET/ROXICET  Take 1-2 tablets by mouth every 4 (four) hours as needed for severe pain (moderate - severe pain).        Wound Care: keep clean and dry  -dressing and staple removal at [facility] -call for apt Postpartum Instructions: [facility] discharge booklet - instructions reviewed  Discharge to: Home  Follow up :  [facility] in 3days for interval visit with Hannah [name] for staple removal [facility] in 6 weeks for routine postpartum visit with Hannah [name]                Signed: Marlinda Mikeanya Snigdha Howser CNM, MSN, [facility] 09/03/2013, 9:18 AM

## 2013-09-03 NOTE — Lactation Note (Addendum)
This note was copied from the chart of Hannah Anjelika Pinzon. Lactation Consultation Note    Follow up consult with this mom of NICU twins, now 67 hours post partum, and 28 3/7 weeks corrected gestation. Baby b, Alley, is on a jet vent and 100% oxygen, very critically ill. Baby A, Katie, is more stable. Mom  Is very teary eyed, and appreciates lots of emotional support. She is pumping every 3 hours, and expressing about 5 mls of transitional milk. I am concerned that she may have hypoplastic breast tissue, and may have problems with supply, but have not said anything to mom Mom's breast have doubled in size in the last 2 days, so hopefully she will do well .  She is so happy to be expressing milk for her girls. Mom rented a DEP for two weeks today, and is getting a PIS DEP from her insurance. I will follow this family in the nICU.  Patient Name: Hannah [name] Today's Date: 09/03/2013 Reason for consult: Follow-up assessment;NICU baby;Infant < 6lbs;Multiple gestation   Maternal Data    Feeding Feeding Type: Breast Milk  LATCH Score/Interventions                      Lactation Tools Discussed/Used Tools: Pump;Comfort gels (mom/s nipples intact but tender - given comfort gels and instructed in their use) Breast pump type: Double-Electric Breast Pump Pump Review: Setup, frequency, and cleaning;Other (comment) (breast care, engorgement)   Consult Status Consult Status: PRN Follow-up type: In-patient (NICU)    [name] 09/03/2013, 2:06 PM    

## 2013-09-03 NOTE — Progress Notes (Signed)
Pt discharged to home with father.  Condition stable.  Pt ambulated to lactation department with plans to revisit babies in NICU. Pt plans to leave hospital from NICU.  Pt home with rented breast pump from lactation department.  No equipment for home ordered at discharge.

## 2013-09-06 ENCOUNTER — Ambulatory Visit: Payer: Self-pay

## 2013-09-06 NOTE — Lactation Note (Signed)
This note was copied from the chart of [name]. Lactation Consultation Note Spoke with mom in NICU today.  Mom is tearful due to recently being informed of twin B, Allison's condition worsening and her very critical condition.  Mom is also very tired.  She states she is pumping about 5 times in 24 hours but is working on increasing frequency.  Mom is pumping 40-80 mls.  Mom praised and supported.  Discussed importance of taking care of herself and resting as much as possible.  Answered questions and encouraged to call for concerns prn. Patient Name: Hannah [name] YSHUO'H Date: 09/06/2013     Maternal Data    Feeding Feeding Type: Breast Milk Length of feed: 10 min  [facility] Score/Interventions                      Lactation Tools Discussed/Used     Consult Status      Hannah [name] 09/06/2013, 3:43 PM

## 2013-09-16 ENCOUNTER — Ambulatory Visit: Payer: Self-pay

## 2013-09-16 NOTE — Lactation Note (Signed)
This note was copied from the chart of Brandee Hopewell. Lactation Consultation Note      Follow up consult with this mom of NICU twins, now 46 weeks old, and 30 2/7 weeks CGA. Mom has a low milk supply. We talked about how sad and stressed she has been, due to how sick Joan Flores is. Mom and dad are going to wait and see how Joan Flores does for 1 more week, and are  thinking of withdrawing support after this.  Mom also has wide spaced, cone shaped breasts, and I explained to mom that due to this, she may not produce a full supply. In order to get mom back to at least 60 mls  every 3 hours, mom is going to try fenugreek and mother's milk, eat and drink better herself, and try to pump at leas 8 times a day. I stressed to mom that she is more important than her milk, and that she needs to do the best she can. I want mom to eat better  and to stay hydrated, more for her sake than for her milk. Mom very receptive to this. I will follow this mom and babies in the NICU.  Patient Name: Hannah [name] PJASN'K Date: 09/16/2013 Reason for consult: Follow-up assessment;NICU baby;Multiple gestation   Maternal Data    Feeding Feeding Type: Breast Milk Length of feed: 60 min  LATCH Score/Interventions                      Lactation Tools Discussed/Used     Consult Status Consult Status: PRN Follow-up type: In-patient (NICU)    Alfred Levins 09/16/2013, 3:45 PM

## 2013-09-24 ENCOUNTER — Ambulatory Visit: Payer: Self-pay

## 2013-09-24 NOTE — Lactation Note (Signed)
This note was copied from the chart of Hannah Cherrise Lasalle. Lactation Consultation Note     Follow up consult with this mom of twins, in NICU, now 3 weeks old, and 31 3/7 CGA. Mom has a very low milk supply, despite fenugreek and mother miolk tea, and pumping 7 times a day. She is considering stopping, and wanted to know how to wean. I suggested that mom begin by stopping the pumping in the middle of the night, and then gradually extending the time between the other pumpings. I advised her to  Pump to comfort, if very full. Mom would love to have a good supply, but with [name] being so unstable, and being under so much stress, I told mom that no matter what, she is a great moom, and what is best for her is best for her family. Mom good with this,   Patient Name: Hannah [name] Today's Date: 09/24/2013 Reason for consult: Follow-up assessment;NICU baby;Multiple gestation   Maternal Data    Feeding Feeding Type: Formula  LATCH Score/Interventions                      Lactation Tools Discussed/Used     Consult Status Consult Status: PRN Follow-up type: In-patient (NICU)    Amelie Caracci Anne 09/24/2013, 6:23 PM    

## 2014-02-10 ENCOUNTER — Encounter (HOSPITAL_COMMUNITY): Payer: Self-pay | Admitting: Obstetrics and Gynecology

## 2014-11-10 LAB — HM PAP SMEAR: HM PAP: NORMAL

## 2015-05-14 ENCOUNTER — Encounter: Payer: Self-pay | Admitting: *Deleted

## 2015-05-14 ENCOUNTER — Telehealth: Payer: Self-pay | Admitting: *Deleted

## 2015-05-14 NOTE — Telephone Encounter (Signed)
Error

## 2015-05-14 NOTE — Telephone Encounter (Signed)
Pre-Visit Call completed with patient and chart updated.   Pre-Visit Info documented in Specialty Comments under [employer].    

## 2015-05-14 NOTE — Telephone Encounter (Signed)
Unable to reach patient at time of pre-visit call. Left message for patient to return call when available.  

## 2015-05-15 ENCOUNTER — Ambulatory Visit (INDEPENDENT_AMBULATORY_CARE_PROVIDER_SITE_OTHER): Payer: Medicaid Other | Admitting: Family

## 2015-05-15 ENCOUNTER — Encounter: Payer: Self-pay | Admitting: Family

## 2015-05-15 ENCOUNTER — Telehealth: Payer: Self-pay | Admitting: Family

## 2015-05-15 VITALS — BP 130/88 | HR 100 | Temp 98.3°F | Resp 18 | Ht 65.0 in | Wt 150.6 lb

## 2015-05-15 DIAGNOSIS — Z0000 Encounter for general adult medical examination without abnormal findings: Secondary | ICD-10-CM | POA: Diagnosis not present

## 2015-05-15 LAB — HEPATIC FUNCTION PANEL
ALBUMIN: 4 g/dL (ref 3.5–5.2)
ALT: 11 U/L (ref 0–35)
AST: 12 U/L (ref 0–37)
Alkaline Phosphatase: 36 U/L — ABNORMAL LOW (ref 39–117)
BILIRUBIN DIRECT: 0.1 mg/dL (ref 0.0–0.3)
BILIRUBIN TOTAL: 0.4 mg/dL (ref 0.2–1.2)
TOTAL PROTEIN: 7.4 g/dL (ref 6.0–8.3)

## 2015-05-15 LAB — CBC WITH DIFFERENTIAL/PLATELET
BASOS PCT: 0.7 % (ref 0.0–3.0)
Basophils Absolute: 0.1 10*3/uL (ref 0.0–0.1)
EOS ABS: 0.2 10*3/uL (ref 0.0–0.7)
Eosinophils Relative: 2.1 % (ref 0.0–5.0)
HCT: 38 % (ref 36.0–46.0)
HEMOGLOBIN: 12.7 g/dL (ref 12.0–15.0)
Lymphocytes Relative: 32.6 % (ref 12.0–46.0)
Lymphs Abs: 2.6 10*3/uL (ref 0.7–4.0)
MCHC: 33.4 g/dL (ref 30.0–36.0)
MCV: 88.5 fl (ref 78.0–100.0)
MONO ABS: 0.7 10*3/uL (ref 0.1–1.0)
Monocytes Relative: 8.9 % (ref 3.0–12.0)
NEUTROS ABS: 4.4 10*3/uL (ref 1.4–7.7)
Neutrophils Relative %: 55.7 % (ref 43.0–77.0)
PLATELETS: 236 10*3/uL (ref 150.0–400.0)
RBC: 4.3 Mil/uL (ref 3.87–5.11)
RDW: 13.3 % (ref 11.5–15.5)
WBC: 7.9 10*3/uL (ref 4.0–10.5)

## 2015-05-15 LAB — URINALYSIS, ROUTINE W REFLEX MICROSCOPIC
Bilirubin Urine: NEGATIVE
KETONES UR: NEGATIVE
Leukocytes, UA: NEGATIVE
NITRITE: NEGATIVE
Specific Gravity, Urine: <=1.005 — AB (ref 1.000–1.030)
TOTAL PROTEIN, URINE-UPE24: NEGATIVE
URINE GLUCOSE: NEGATIVE
UROBILINOGEN UA: 0.2 (ref 0.0–1.0)
pH: 6 (ref 5.0–8.0)

## 2015-05-15 LAB — LIPID PANEL
CHOLESTEROL: 194 mg/dL (ref 0–200)
HDL: 75.3 mg/dL (ref >39.00)
LDL Cholesterol: 97 mg/dL (ref 0–99)
NonHDL: 118.79
TRIGLYCERIDES: 111 mg/dL (ref 0.0–149.0)
Total CHOL/HDL Ratio: 3
VLDL: 22.2 mg/dL (ref 0.0–40.0)

## 2015-05-15 LAB — BASIC METABOLIC PANEL
BUN: 14 mg/dL (ref 6–23)
CHLORIDE: 106 meq/L (ref 96–112)
CO2: 28 mEq/L (ref 19–32)
CREATININE: 0.81 mg/dL (ref 0.40–1.20)
Calcium: 9.1 mg/dL (ref 8.4–10.5)
GFR: 86.15 mL/min (ref >60.00)
Glucose, Bld: 89 mg/dL (ref 70–99)
Potassium: 4 mEq/L (ref 3.5–5.1)
Sodium: 140 mEq/L (ref 135–145)

## 2015-05-15 LAB — TSH: TSH: 2.02 u[IU]/mL (ref 0.35–4.50)

## 2015-05-15 MED ORDER — HYDROCORTISONE ACE-PRAMOXINE 1-1 % RE FOAM: 1.0000 | Freq: Two times a day (BID) | RECTAL | Status: DC

## 2015-05-15 NOTE — Progress Notes (Signed)
Subjective:    Patient ID: Hannah [name], female    DOB: [birth year], 35 y.o.   MRN: [id number]  HPI  Hannah [name] is a 35 yr old female who presents today to establish care.   Pmhx is significant for HTN.  She is not currently maintained on anti-hypertensive.  Reports that 7 yrs ago she was on meds.  BP improved.  She thinks that this is white coat driven.   BP Readings from Last 3 Encounters:  05/15/15 130/88  09/03/13 118/67   Today she is requesting rx for hemorrhoids. Notes intermittent flare of hemorrhoids.    Patient presents today for complete physical.  Immunizations: tdap 2015,  Flu shot up to date Diet: reports healthy diet Exercise:  walks Pap Smear: fall 2016 Vision exam: due Dental: up to date, 2 weeks ago  Review of Systems  Constitutional: Negative for unexpected weight change.  HENT: Negative for rhinorrhea.   Respiratory: Negative for cough and shortness of breath.   Cardiovascular: Negative for leg swelling.  Gastrointestinal: Negative for diarrhea and constipation.  Genitourinary: Negative for dysuria, frequency and menstrual problem.  Musculoskeletal: Negative for myalgias and arthralgias.  Skin: Negative for rash.  Neurological:       Rare migraines- if not sleeping well, resolves with excedrin migraine  Hematological: Negative for adenopathy.  Psychiatric/Behavioral:       Denies depression/anxiety   Past Medical History  Diagnosis Date  . Medical history non-contributory   . Hypertension     Social History   Social History  . Marital Status: Married    Spouse Name: N/A  . Number of Children: N/A  . Years of Education: N/A   Occupational History  . Not on file.   Social History Main Topics  . Smoking status: Never Smoker   . Smokeless tobacco: Not on file  . Alcohol Use: Yes     Comment: occassioanl social drinker, not since pregnant  . Drug Use: No  . Sexual Activity: Not Currently   Other Topics Concern  . Not on file    Social History Narrative    Past Surgical History  Procedure Laterality Date  . No past surgeries    . Mandible fracture surgery Bilateral 2000  . Cesarean section N/A 08/31/2013    Procedure: CESAREAN SECTION;  Surgeon: Serita Kyle, MD;  Location: [facility];  Service: Obstetrics;  Laterality: N/A;    Family History  Problem Relation Age of Onset  . Alcohol abuse Father   . Arthritis Father   . Hyperlipidemia Father   . Hypertension Father   . Diabetes Maternal Aunt   . Depression Paternal Aunt   . Depression Paternal Uncle   . Diabetes Maternal Grandmother   . Kidney disease Mother     Allergies  Allergen Reactions  . Tetracyclines & Related Other (See Comments)    Severe stomach cramps  . Sulfa Antibiotics Rash    After sun exposure    Current Outpatient Prescriptions on File Prior to Visit  Medication Sig Dispense Refill  . drospirenone-ethinyl estradiol (YAZ,GIANVI,LORYNA) 3-0.02 MG tablet Take 1 tablet by mouth daily.    . Multiple Vitamins-Minerals (MULTIVITAMIN ADULT PO) Take 1 tablet by mouth daily.     No current facility-administered medications on file prior to visit.    BP 130/88 mmHg  Pulse 100  Temp(Src) 98.3 F (36.8 C) (Oral)  Resp 18  Ht  (1.651 m)  Wt 150 lb 9.6 oz (68.312 kg)  BMI 25.06 kg/m2  SpO2 100%  LMP 05/15/2015       Objective:   Physical Exam  Physical Exam  Constitutional: She is oriented to person, place, and time. She appears well-developed and well-nourished. No distress.  HENT:  Head: Normocephalic and atraumatic.  Right Ear: Tympanic membrane and ear canal normal.  Left Ear: Tympanic membrane and ear canal normal.  Mouth/Throat: Oropharynx is clear and moist.  Eyes: Pupils are equal, round, and reactive to light. No scleral icterus.  Neck: Normal range of motion. No thyromegaly present.  Cardiovascular: Normal rate and regular rhythm.   No murmur heard. Pulmonary/Chest: Effort normal and breath sounds  normal. No respiratory distress. He has no wheezes. She has no rales. She exhibits no tenderness.  Abdominal: Soft. Bowel sounds are normal. He exhibits no distension and no mass. There is no tenderness. There is no rebound and no guarding.  Musculoskeletal: She exhibits no edema.  Lymphadenopathy:    She has no cervical adenopathy.  Neurological: She is alert and oriented to person, place, and time. She has normal patellar reflexes. She exhibits normal muscle tone. Coordination normal.  Skin: Skin is warm and dry.  Psychiatric: She has a normal mood and affect. Her behavior is normal. Judgment and thought content normal.  Breast/pelvic: deferred to GYN            Assessment & Plan:         Assessment & Plan:

## 2015-05-15 NOTE — Telephone Encounter (Signed)
I have placed her on the nurse schedule for Monday at 9:30am. We do not schedule a time for the PPD reading. Melissa said it is ok to read test before 8 am on Wednesday but she will have to wait on me as I will be the only one here to read it before 8.  Thank you!

## 2015-05-15 NOTE — Telephone Encounter (Signed)
Relation to ZO:XWRU Call back number:[phone]   Reason for call:  As per 05/15/15 AVS: Please schedule nurse visit on Monday for PPD placement and on Wednesday for PPD reading. Patient would like to schedule PPD for Monday at 9:3am slot is blocked please approve and patient would like to come in on Wednesday before 8am to get it read. Please advise

## 2015-05-15 NOTE — Patient Instructions (Signed)
Please schedule nurse visit on Monday for PPD placement and on Wednesday for PPD reading.  Complete lab work prior to leaving.

## 2015-05-15 NOTE — Telephone Encounter (Signed)
Patient advised and will come in Wednesday at 4:45p to get TB read

## 2015-05-15 NOTE — Assessment & Plan Note (Signed)
Discussed healthy diet, exercise. Obtain routine lab work.   

## 2015-05-15 NOTE — Progress Notes (Signed)
Pre visit review using our clinic review tool, if applicable. No additional management support is needed unless otherwise documented below in the visit note. 

## 2015-05-17 ENCOUNTER — Encounter: Payer: Self-pay | Admitting: Family

## 2015-05-18 ENCOUNTER — Ambulatory Visit (INDEPENDENT_AMBULATORY_CARE_PROVIDER_SITE_OTHER): Payer: BLUE CROSS/BLUE SHIELD | Admitting: *Deleted

## 2015-05-18 DIAGNOSIS — Z111 Encounter for screening for respiratory tuberculosis: Secondary | ICD-10-CM

## 2015-05-18 NOTE — Progress Notes (Signed)
Pre visit review using our clinic review tool, if applicable. No additional management support is needed unless otherwise documented below in the visit note.  Pt here for TB skin test. Placed LFA. Pt tolerated injection well.  Starla Link, RN

## 2015-05-20 LAB — TB SKIN TEST
Induration: 0 mm
TB Skin Test: NEGATIVE

## 2015-05-20 NOTE — Telephone Encounter (Signed)
TB read and form placed in PCP's red folder for signature. Pt aware form will not be signed until Friday when PCP is back in the office.

## 2015-05-22 NOTE — Telephone Encounter (Signed)
Form faxed to [place] at [phone].

## 2015-06-01 ENCOUNTER — Telehealth: Payer: Self-pay | Admitting: General Practice

## 2015-06-01 NOTE — Telephone Encounter (Signed)
PA received from [facility], [place] began with covermymeds today.

## 2015-06-03 NOTE — Telephone Encounter (Signed)
Received Denial letter from [facility] for proctofoam, per provider instructions, LMOM with contact name and number for patient to inform of insurance denial and also to try OTC Preparation-H; also, if any further questions and/or concerns to return call/SLS 02/22

## 2015-07-14 IMAGING — US US MFM OB TRANSVAGINAL
1 series · 13 of 28 positions shown · non-contrast
Comparison: none

[Series 1: us mfm ob transvaginal · 0.26mm/px · 13 of 50 slices shown]
[im 2/50]
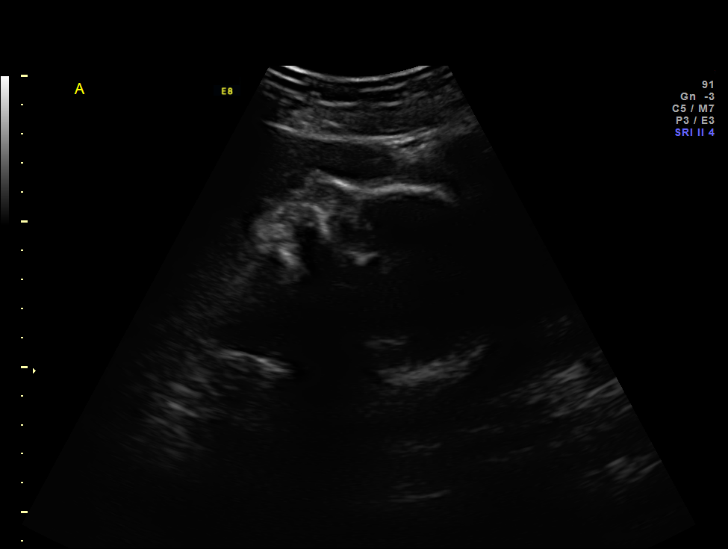
[im 6/50]
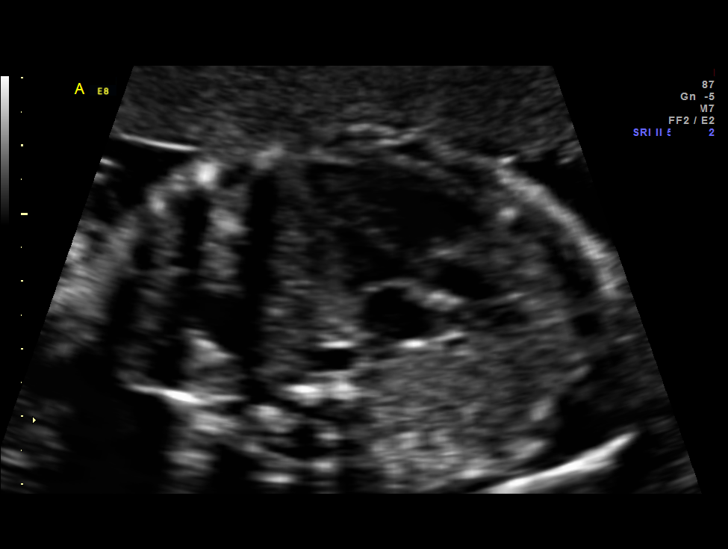
[im 10/50]
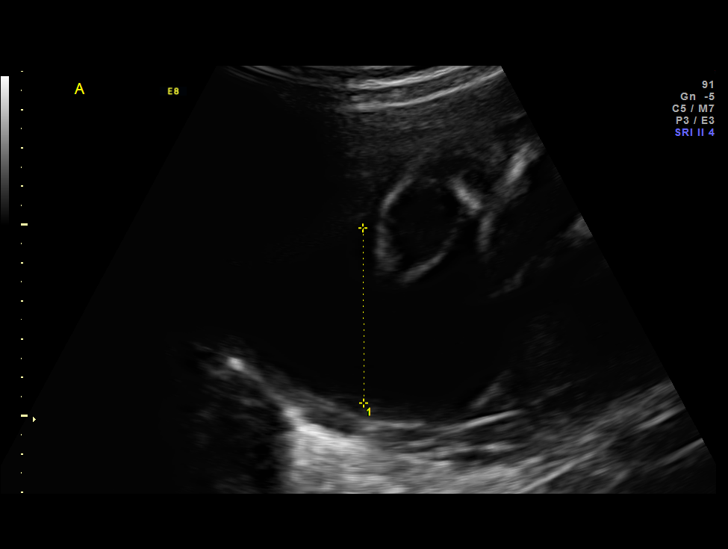
[im 13/50]
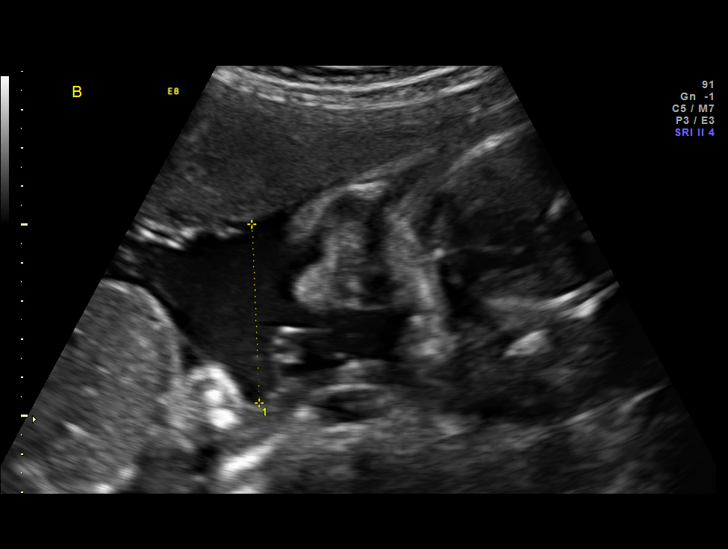
[im 17/50]
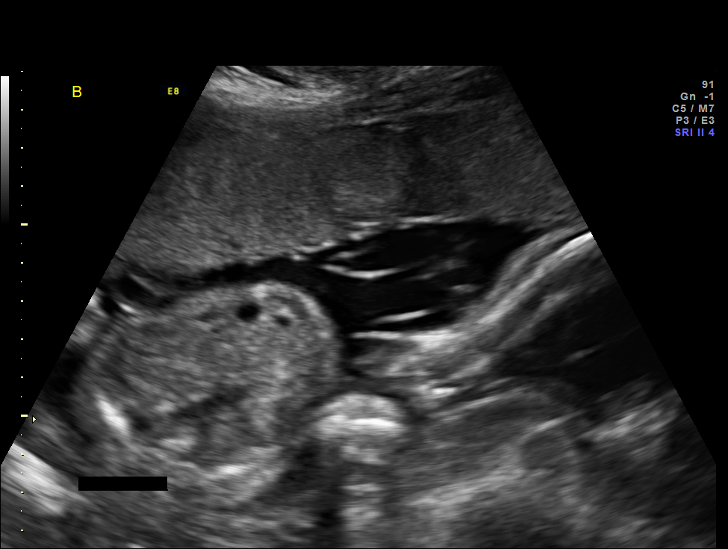
[im 20/50]
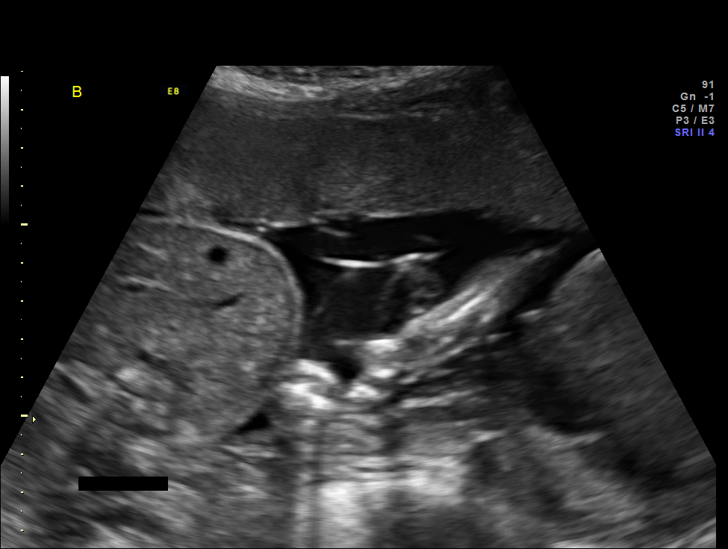
[im 26/50]
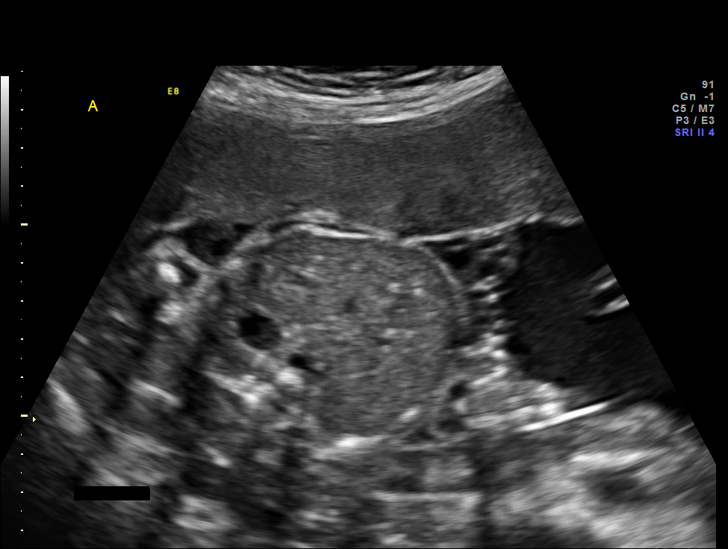
[im 30/50]
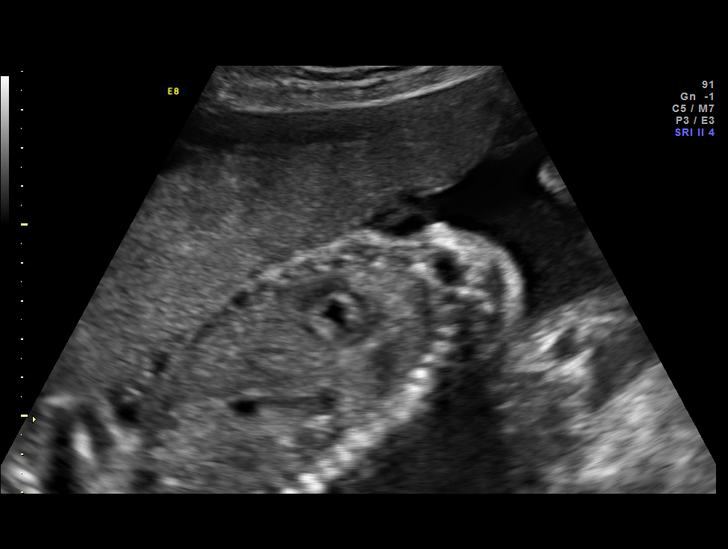
[im 33/50]
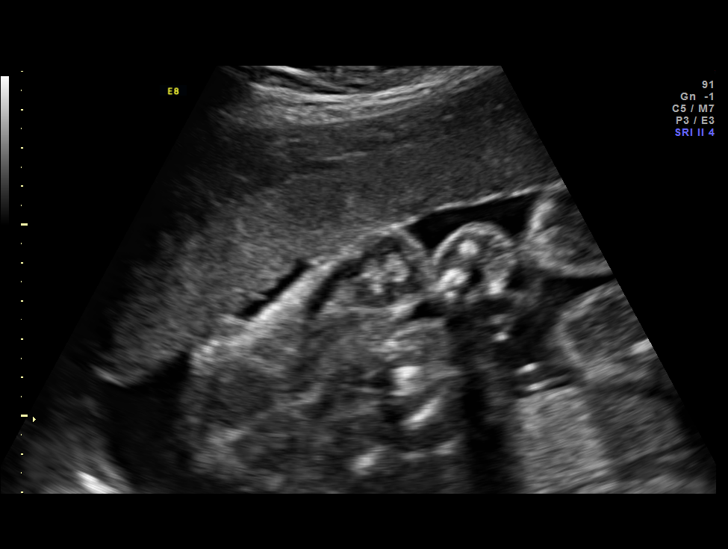
[im 37/50]
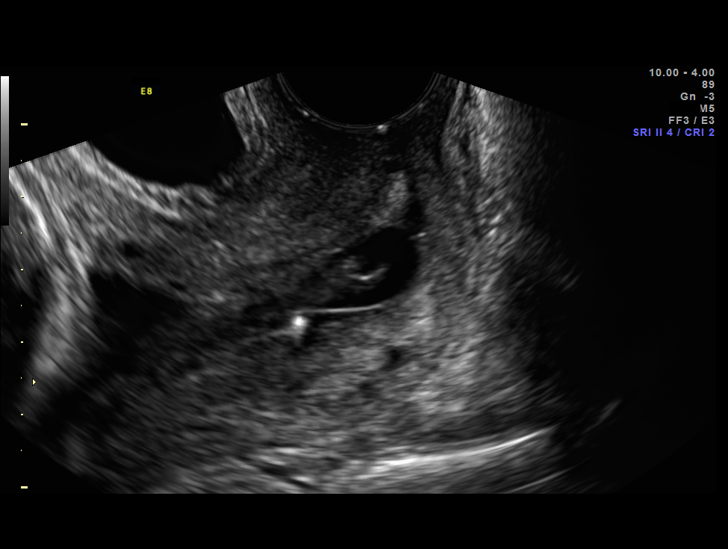
[im 40/50]
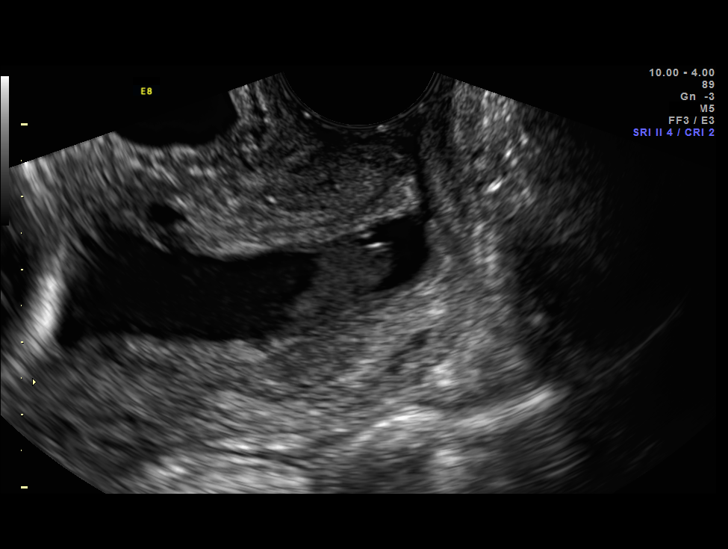
[im 44/50]
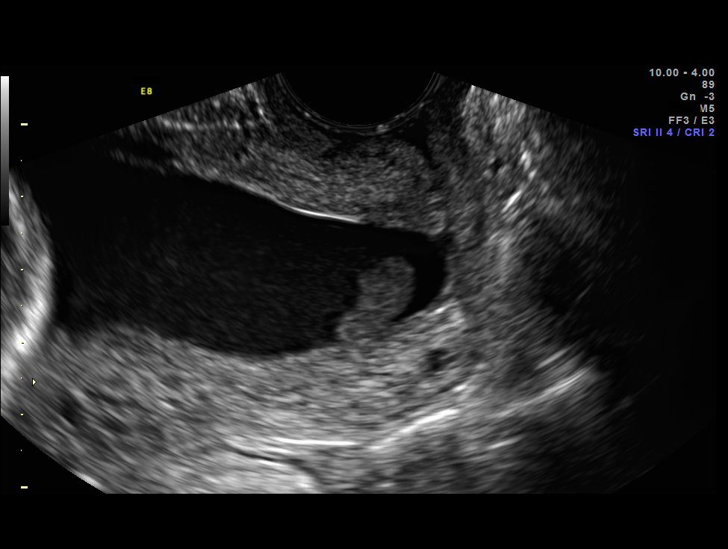
[im 48/50]
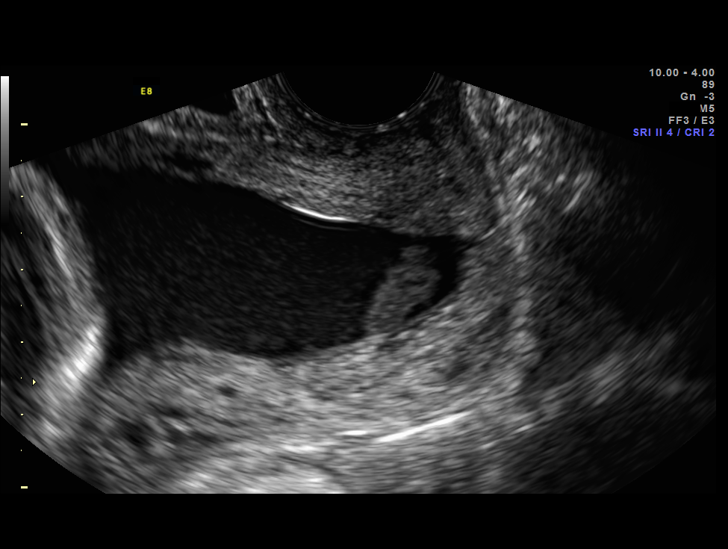

[13 of 28 positions shown; findings below may reference images not displayed]

OBSTETRICS REPORT
                      (Signed Final 08/21/2013 [DATE])

Service(s) Provided

 US MFM OB TRANSVAGINAL                                76817.2
Indications

 Cervical shortening
 Hypertension - Chronic/Pre-existing on Aldomet
 Twin gestation: DI/Di per records
Fetal Evaluation (Fetus A)

 Num Of Fetuses:    2
 Fetal Heart Rate:  150                          bpm
 Cardiac Activity:  Observed
 Fetal Lie:         Lower Fetus
 Presentation:      Cephalic
 Placenta:          Anterior, above cervical os

 Amniotic Fluid
 AFI FV:      Subjectively within normal limits
                                             Larg Pckt:     4.6  cm
Gestational Age (Fetus A)

 LMP:           26w 4d        Date:  02/16/13                 EDD:   11/23/13
 Best:          26w 4d     Det. By:  LMP  (02/16/13)          EDD:   11/23/13

Fetal Evaluation (Fetus B)

 Num Of Fetuses:    2
 Fetal Heart Rate:  150                          bpm
 Cardiac Activity:  Observed
 Fetal Lie:         Upper Fetus
 Presentation:      Transverse, head to
                    maternal left
 Placenta:          Anterior, above cervical os

 Amniotic Fluid
 AFI FV:      Subjectively within normal limits
                                             Larg Pckt:     4.7  cm
Gestational Age (Fetus B)
 LMP:           26w 4d        Date:  02/16/13                 EDD:   11/23/13
 Best:          26w 4d     Det. By:  LMP  (02/16/13)          EDD:   11/23/13
Cervix Uterus Adnexa

 Cervical Length:    0.4      cm

 Cervix:       Measured transvaginally.
Comments

 MFM Note

 I met with Ms. Alexys Candia and we had a lengthy
 discussion regarding her options at this point (26+ week di/di
 twins with significant cervical funneling). Basically, she is left
 with decreased activity with expectant managment and
 pessary placement. The data on the use of a pessary for
 cervical insufficiency in the [place] is limited and with small
 numbers; however, there is some evidence that they may be
 beneficial in prolonging pregnancies at risk for preterm
 delivery including multiples. In general, they are thought to be
 safe, however, she is at risk for SROM at any point without
 intervention. Ms. Herrera seemed to be very interested in
 pursuing this management. So far, we have been bringing
 them over from our office in [place], so I'll will try to get one to
 you ASAP (I work there tomorrow).
Impression

 Dichorionic/diamniotic twin pregnancy at 26+4 weeks
 Normal amniotic fluid volume x 2
 EV views of cervix: significant funneling extending down to
 the external os with very little closed portion left (cervix closed
 on exam).

 questions or concerns.

## 2015-07-20 IMAGING — US US MFM OB TRANSVAGINAL
1 series · 13 of 14 positions shown · non-contrast
Comparison: none

[Series 1: us mfm ob transvaginal · 0.23mm/px · 13 of 14 slices shown]
[im 1/14]
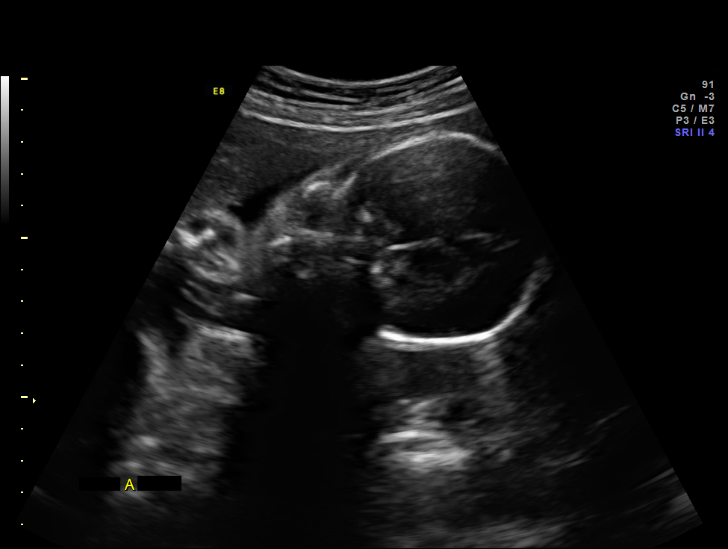
[im 2/14]
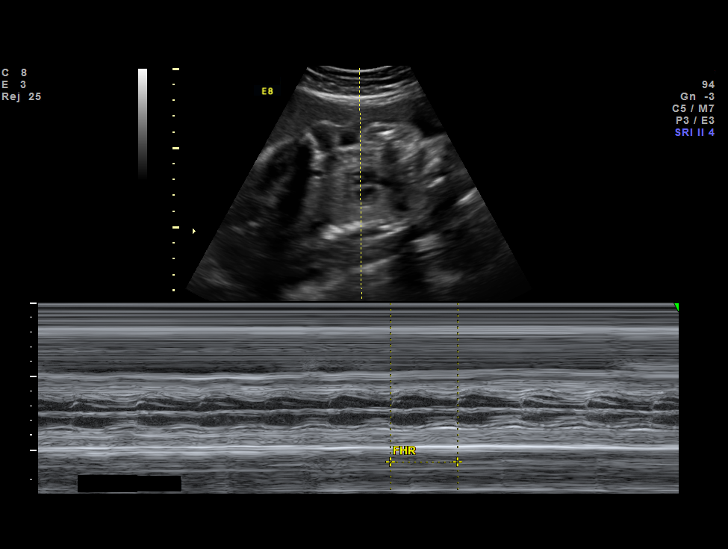
[im 3/14]
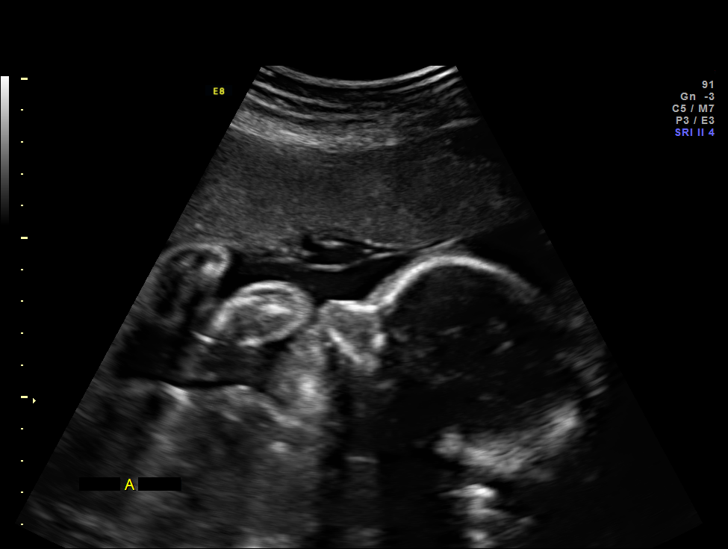
[im 4/14]
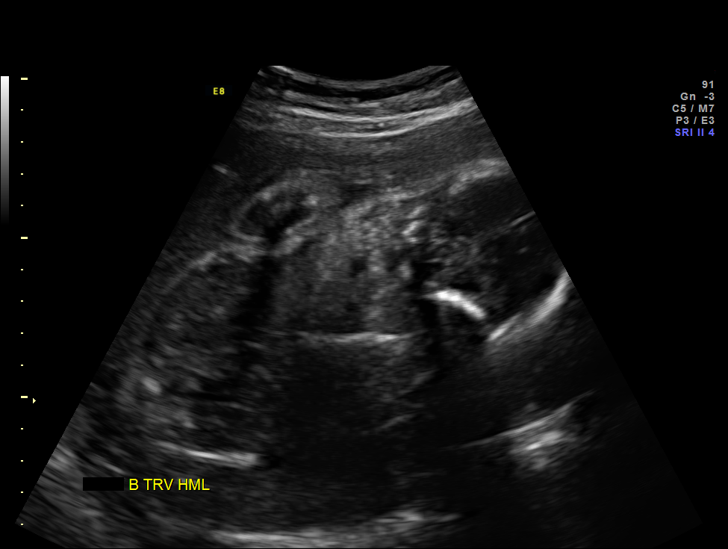
[im 5/14]
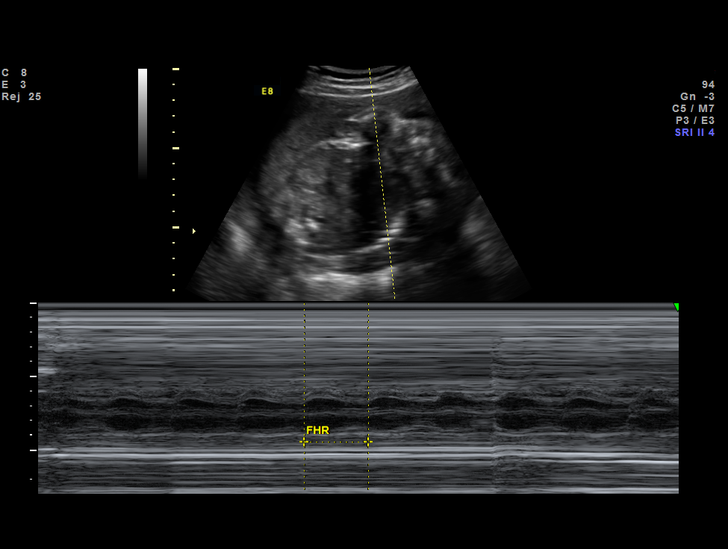
[im 6/14]
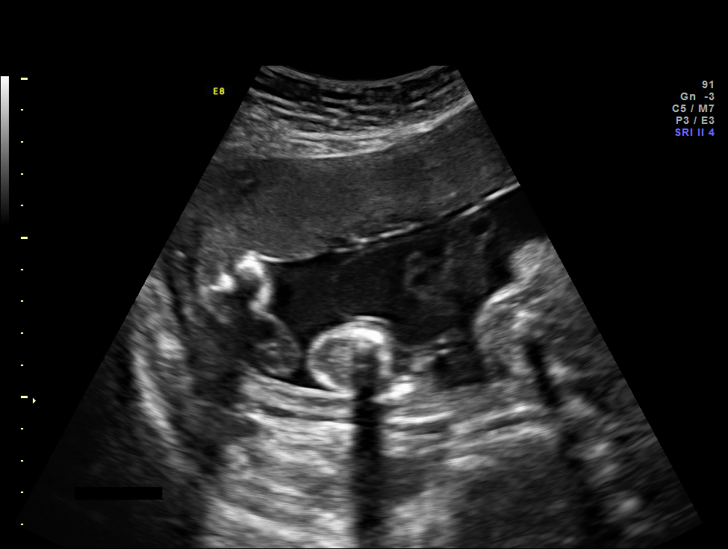
[im 8/14]
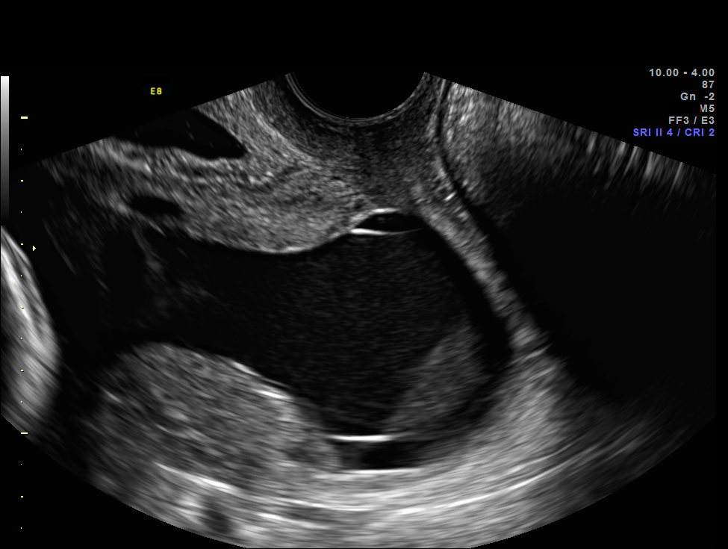
[im 9/14]
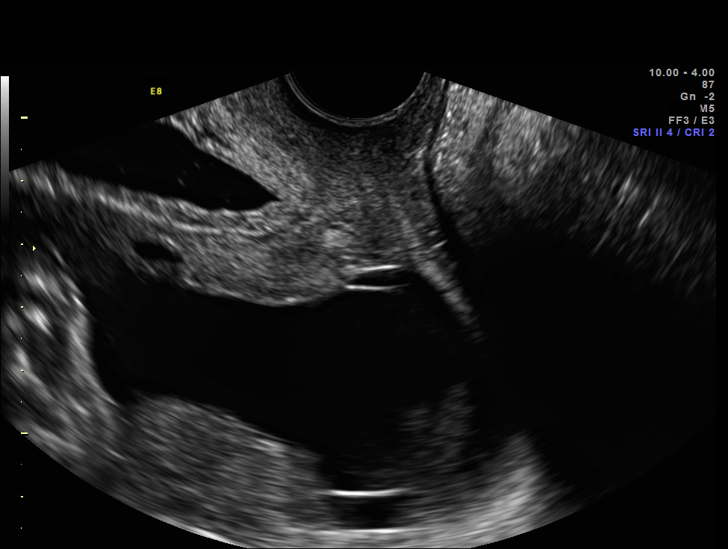
[im 10/14]
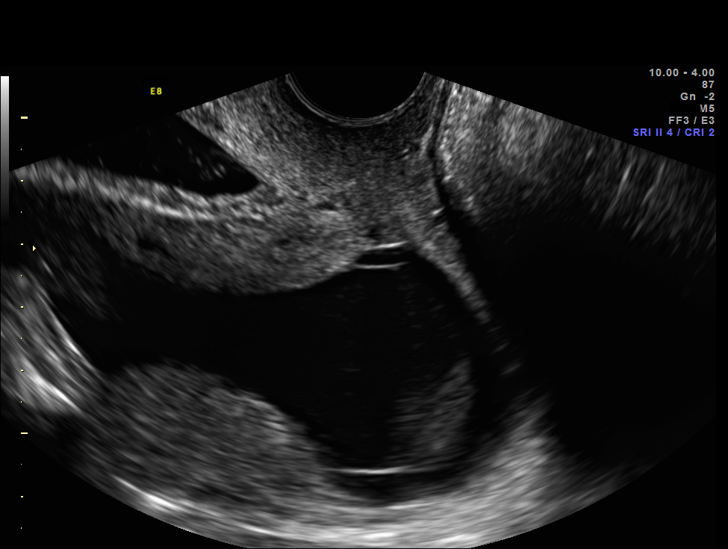
[im 11/14]
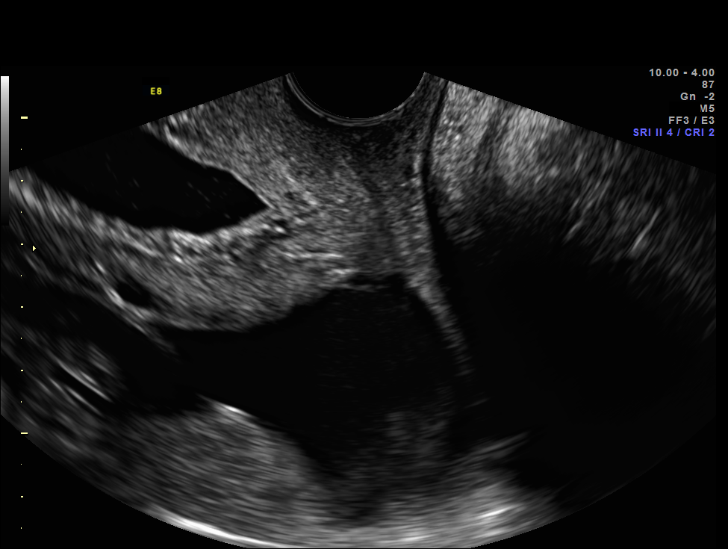
[im 12/14]
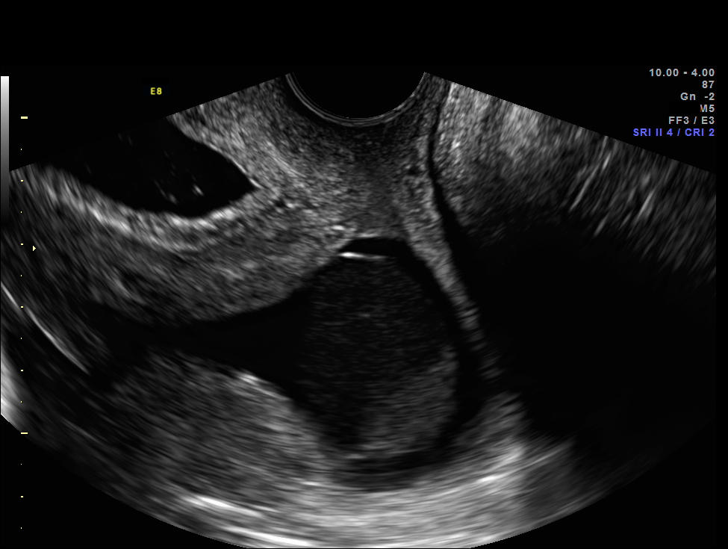
[im 13/14]
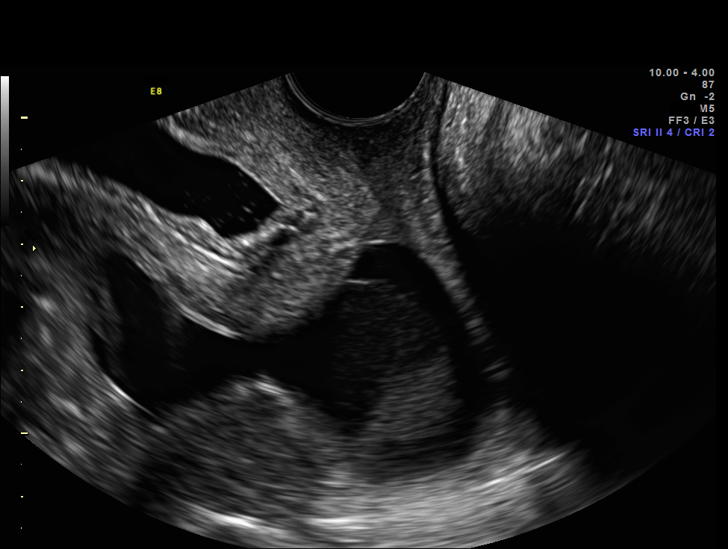
[im 14/14]
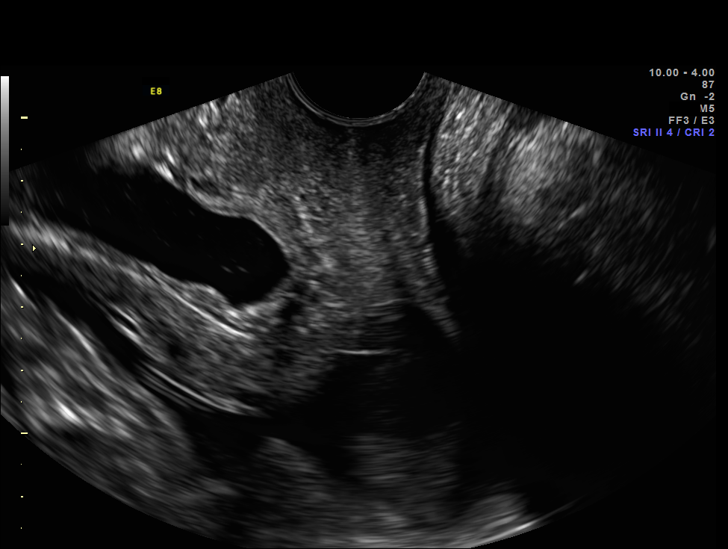

[13 of 14 positions shown; findings below may reference images not displayed]

OBSTETRICS REPORT
                      (Signed Final 08/27/2013 [DATE])

Service(s) Provided

 US MFM OB TRANSVAGINAL                                76817.2
Indications

 Cervical shortening
 Hypertension - Chronic/Pre-existing on Aldomet
 Twin gestation: DI/Di per records
Fetal Evaluation (Fetus A)

 Num Of Fetuses:    2
 Fetal Heart Rate:  150                          bpm
 Cardiac Activity:  Observed
 Fetal Lie:         Lower Fetus
 Presentation:      Cephalic
 Placenta:          Anterior, above cervical os

 Amniotic Fluid
 AFI FV:      Subjectively within normal limits
Gestational Age (Fetus A)

 LMP:           27w 3d        Date:  02/16/13                 EDD:   11/23/13
 Best:          27w 3d     Det. By:  LMP  (02/16/13)          EDD:   11/23/13

Fetal Evaluation (Fetus B)

 Num Of Fetuses:    2
 Fetal Heart Rate:  8898                         bpm
 Cardiac Activity:  Observed
 Fetal Lie:         Upper Fetus
 Presentation:      Transverse, head to
                    maternal left
 Placenta:          Anterior, above cervical os

 Amniotic Fluid
 AFI FV:      Subjectively within normal limits
Gestational Age (Fetus B)

 LMP:           27w 3d        Date:  02/16/13                 EDD:   11/23/13
 Best:          27w 3d     Det. By:  LMP  (02/16/13)          EDD:   11/23/13
Cervix Uterus Adnexa

 Cervical Length:    0        cm

 Cervix:       Measured transvaginally.
Impression

 DC/DA twin gestation at 83w7d
 Limited ultrasound performed for cervical length
 Twin A (presenting twin) - cephalic
 Twin B - transverse with fetal head on the maternal left

 TVUS - the cervix is open with membranes visible in the
 vagina

 Ultrasound findings were discussed with the patient.  Based
 on ultrasound and physical exam findings, she is not a
 candidate for pessary and at significant risk for PROM.
Recommendations

 See separate consult
 Recommend a 12-hour course of Magnesium sulfate for
 Neuroprotection

 questions or concerns.

## 2015-11-04 ENCOUNTER — Ambulatory Visit (INDEPENDENT_AMBULATORY_CARE_PROVIDER_SITE_OTHER): Payer: Self-pay | Admitting: Medical

## 2015-11-04 ENCOUNTER — Encounter: Payer: Self-pay | Admitting: Medical

## 2015-11-04 ENCOUNTER — Telehealth: Payer: Self-pay | Admitting: Family

## 2015-11-04 VITALS — BP 124/80 | HR 77 | Temp 98.1°F | Ht 65.0 in | Wt 146.8 lb

## 2015-11-04 DIAGNOSIS — H9201 Otalgia, right ear: Secondary | ICD-10-CM | POA: Diagnosis not present

## 2015-11-04 DIAGNOSIS — H6123 Impacted cerumen, bilateral: Secondary | ICD-10-CM | POA: Diagnosis not present

## 2015-11-04 MED ORDER — NEOMYCIN-POLYMYXIN-HC 3.5-10000-1 OT SUSP: 3.0000 [drp] | Freq: Four times a day (QID) | OTIC | Status: DC

## 2015-11-04 MED ORDER — NEOMYCIN-POLYMYXIN-HC 1 % OT SOLN: 3.0000 [drp] | Freq: Four times a day (QID) | OTIC | 0 refills | Status: DC

## 2015-11-04 NOTE — Addendum Note (Signed)
Addended by: Gwenevere Abbot on: 11/04/2015 06:44 PM   Modules accepted: Orders

## 2015-11-04 NOTE — Patient Instructions (Signed)
Your cerumen impaction has cleared almost completely. For residual discomfort rx cortisporin otic.  You briefly mentioned possible allergies at end of interview. If any ear pressure sensation can add flonase otc.  For any increase pain behind ear the would give antibiotic but not needed presently.  TMJ conservative measures advised though I don't think this is case presently.  Follow up 7 days any residual symptoms or as needed

## 2015-11-04 NOTE — Telephone Encounter (Signed)
Relation to OI:PPGF Call back number: Pharmacy:  Reason for call:  Patient states  [id number] - [place], [place] - [name] AT [address] OF [phone] (Phone) [phone] (Fax)   Never received neomycin-polymyxin-hydrocortisone (CORTISPORIN) otic suspension 3 drop

## 2015-11-04 NOTE — Progress Notes (Signed)
Pre visit review using our clinic review tool, if applicable. No additional management support is needed unless otherwise documented below in the visit note. 

## 2015-11-04 NOTE — Progress Notes (Signed)
Subjective:    Patient ID: Hannah [name], female    DOB: November 19, 1980, 35 y.o.   MRN: [id number]  HPI   Pt in with rt ear plugged sensation. About one week ago faint transient mild jaw area. Pt had jaw surgery as teenager. No tmj history. No popping of rt tmj joint. Pt has been swimming recently. Occassional history of swimmers ear.  2 weeks ago went to the dentist and told no problems.  Pt describes faint discomfort in and around her rt ear.     Review of Systems  Constitutional: Negative for chills, fatigue and fever.  HENT:       Rt ear plugged sensation.  Respiratory: Negative for cough, chest tightness, shortness of breath and wheezing.   Cardiovascular: Negative for chest pain and palpitations.  Neurological: Negative for dizziness and headaches.  Hematological: Negative for adenopathy. Does not bruise/bleed easily.  Psychiatric/Behavioral: Negative for behavioral problems.   Past Medical History:  Diagnosis Date  . Hypertension   . Medical history non-contributory      Social History   Social History  . Marital status: Married    Spouse name: N/A  . Number of children: N/A  . Years of education: N/A   Occupational History  . Not on file.   Social History Main Topics  . Smoking status: Never Smoker  . Smokeless tobacco: Not on file  . Alcohol use Yes     Comment: occassioanl social drinker, not since pregnant  . Drug use: No  . Sexual activity: Not Currently   Other Topics Concern  . 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 CESAREAN SECTION N/A 08/31/2013   Procedure: CESAREAN SECTION;  Surgeon: Serita Kyle, MD;  Location: [facility];  Service: Obstetrics;  Laterality: N/A;  . MANDIBLE FRACTURE SURGERY Bilateral  2000  . NO PAST SURGERIES      Family History  Problem Relation Age of Onset  . Alcohol abuse Father   . Arthritis Father   . Hyperlipidemia Father   . Hypertension Father   . Diabetes Maternal Aunt   . Depression Paternal Aunt   . Depression Paternal Uncle   . Diabetes Maternal Grandmother   . Kidney disease Mother     Allergies  Allergen Reactions  . Tetracyclines & Related Other (See Comments)    Severe stomach cramps  . Sulfa Antibiotics Rash    After sun exposure    Current Outpatient Prescriptions on File Prior to Visit  Medication Sig Dispense Refill  . drospirenone-ethinyl estradiol (YAZ,GIANVI,LORYNA) 3-0.02 MG tablet Take 1 tablet by mouth daily.    . hydrocortisone-pramoxine (PROCTOFOAM HC) rectal foam Place 1 applicator rectally 2 (two) times daily. 10 g 2  . Multiple Vitamins-Minerals (MULTIVITAMIN ADULT PO) Take 1 tablet by mouth daily.     No current facility-administered medications on file prior to visit.     BP 124/80 (BP Location: Right Arm, Patient Position: Sitting, Cuff Size: Normal)   Pulse 77   Temp 98.1 F (36.7 C) (Oral)   Ht  (1.651 m)   Wt 146 lb 12.8 oz (66.6 kg)   SpO2 98%   BMI 24.43 kg/m  Objective:   Physical Exam  General  Mental Status - Alert. General Appearance - Well groomed. Not in acute distress.  Skin Rashes- No Rashes.  HEENT Head- Normal. Ear Auditory Canal - Left- Normal. Right - Normal.Tympanic Membrane- Left- prelavage could not see tm. Right- partial wax obstruction. Tm portion seen looks normal. No tragal tenderness on either side. Rt side possible faint posterior auricle pain(but none post lavage). Post lavage ears feel slight irritated Both tm look clear. Rt side 95-98% wax cleared. Eye Sclera/Conjunctiva- Left- Normal. Right- Normal. Nose & Sinuses Nasal Mucosa- Left-  Not Boggy and Congested. Right- Not  Boggy and  Congested.Bilateral  No maxillary and No  frontal sinus pressure. Mouth &  Throat Lips: Upper Lip- Normal: no dryness, cracking, pallor, cyanosis, or vesicular eruption. Lower Lip-Normal: no dryness, cracking, pallor, cyanosis or vesicular eruption. Buccal Mucosa- Bilateral- No Aphthous ulcers. Oropharynx- No Discharge or Erythema. Tonsils: Characteristics- Bilateral- No Erythema or Congestion. Size/Enlargement- Bilateral- No enlargement. Discharge- bilateral-None.  Neck Neck- Supple. No Masses.   Chest and Lung Exam Auscultation: Breath Sounds:-Clear even and unlabored.  Cardiovascular Auscultation:Rythm- Regular, rate and rhythm. Murmurs & Other Heart Sounds:Ausculatation of the heart reveal- No Murmurs.  Lymphatic Head & Neck General Head & Neck Lymphatics: Bilateral: Description- No Localized lymphadenopathy.       Assessment & Plan:  Your cerumen impaction has cleared almost completely. For residual discomfort rx cortisporin otic.  You briefly mentioned possible allergies at end of interview. If any ear pressure sensation can add flonase otc.  For any increase pain behind ear the would give antibiotic but not needed presently.  TMJ conservative measures advised though I don't think this is case presently.  Follow up 7 days any residual symptoms or as needed    Jazline Cumbee, Ramon Dredge, [employer]

## 2015-11-05 NOTE — Telephone Encounter (Signed)
Spoke with pharmacist and she states that the Rx was picked up yesterday afternoon.

## 2016-01-22 ENCOUNTER — Telehealth: Payer: Self-pay | Admitting: Family

## 2016-01-22 MED ORDER — HYDROCORTISONE ACE-PRAMOXINE 1-1 % RE FOAM: 1.0000 | Freq: Two times a day (BID) | RECTAL | 2 refills | Status: DC

## 2016-01-22 NOTE — Telephone Encounter (Signed)
Patient is requesting a refill of hydrocortisone-pramoxine (PROCTOFOAM HC) rectal foam   Pharm[phone] - [place], [place] - [name]

## 2016-01-22 NOTE — Telephone Encounter (Signed)
Refill sent, notified pt. 

## 2016-01-25 ENCOUNTER — Encounter: Payer: Self-pay | Admitting: Family

## 2016-01-25 ENCOUNTER — Ambulatory Visit (INDEPENDENT_AMBULATORY_CARE_PROVIDER_SITE_OTHER): Payer: BC Managed Care – PPO | Admitting: Family

## 2016-01-25 VITALS — BP 132/84 | HR 87 | Temp 98.1°F | Resp 16 | Ht 65.0 in | Wt 147.4 lb

## 2016-01-25 DIAGNOSIS — K649 Unspecified hemorrhoids: Secondary | ICD-10-CM | POA: Diagnosis not present

## 2016-01-25 MED ORDER — HYDROCORTISONE ACETATE 25 MG RE SUPP: 25.0000 mg | Freq: Two times a day (BID) | RECTAL | 3 refills | Status: DC | PRN

## 2016-01-25 NOTE — Progress Notes (Signed)
Subjective:    Patient ID: Hannah [name], female    DOB: 05/11/1980, 35 y.o.   MRN: [id number]  HPI  Hannah [name] is a 35 yr old female who presents today with chief complaint of rectal pain. Reports pain has been present x 1 month. Some improvement with foam. Prefers suppositories. Symptoms seem to have flared after she has constipation. She reports minimal pain but intense itching.    Review of Systems See  HPI  Past Medical History:  Diagnosis Date  . Hypertension   . Medical history non-contributory      Social History   Social History  . Marital status: Married    Spouse name: N/A  . Number of children: N/A  . Years of education: N/A   Occupational History  . Not on file.   Social History Main Topics  . Smoking status: Never Smoker  . Smokeless tobacco: Not on file  . Alcohol use Yes     Comment: occassioanl social drinker, not since pregnant  . Drug use: No  . Sexual activity: Not Currently   Other Topics Concern  . 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 CESAREAN SECTION N/A 08/31/2013   Procedure: CESAREAN SECTION;  Surgeon: Serita KyleSheronette A Cousins, MD;  Location: [facility];  Service: Obstetrics;  Laterality: N/A;  . MANDIBLE FRACTURE SURGERY Bilateral 2000  . NO PAST SURGERIES      Family History  Problem Relation Age of Onset  . Alcohol abuse Father   . Arthritis Father   . Hyperlipidemia Father   . Hypertension Father   . Diabetes Maternal Aunt   . Depression Paternal Aunt   . Depression Paternal Uncle   . Diabetes Maternal Grandmother   . Kidney disease Mother     Allergies  Allergen Reactions  . Tetracyclines & Related Other (See Comments)    Severe stomach cramps  . Sulfa Antibiotics Rash    After sun  exposure    Current Outpatient Prescriptions on File Prior to Visit  Medication Sig Dispense Refill  . drospirenone-ethinyl estradiol (YAZ,GIANVI,LORYNA) 3-0.02 MG tablet Take 1 tablet by mouth daily.    . hydrocortisone-pramoxine (PROCTOFOAM HC) rectal foam Place 1 applicator rectally 2 (two) times daily. 10 g 2  . Multiple Vitamins-Minerals (MULTIVITAMIN ADULT PO) Take 1 tablet by mouth daily.     No current facility-administered medications on file prior to visit.     BP 132/84 (BP Location: Left Arm, Patient Position: Sitting, Cuff Size: Normal)   Pulse 87   Temp 98.1 F (36.7 C) (Oral)   Resp 16   Ht 5\' 5"  (1.651 m)   Wt 147 lb 6.4 oz (66.9 kg)   LMP 01/11/2016   SpO2 100% Comment: room air.  BMI 24.53 kg/m       Objective:   Physical Exam  Constitutional: She appears well-developed and well-nourished.  Cardiovascular: Normal rate, regular rhythm and normal heart sounds.   No murmur heard. Pulmonary/Chest: Effort normal and breath sounds normal. No respiratory distress. She has no wheezes.  Genitourinary:  Genitourinary Comments: Small external hemorrhoid noted.   Psychiatric: She has a normal mood and affect. Her behavior is normal. Judgment and thought content  normal.          Assessment & Plan:  Hemorrhoids- advised pt as follows:   Try adding a small dose of miralax such as 1 tablespoon in juice every other day. Titrate frequency as needed to keep your bowel movements soft.  You may continue proctofoam as needed.   You may begin hydrocortisone suppositories as needed. Eat lots of fresh fruits and veggies and call if symptoms worsen or do not improve.

## 2016-01-25 NOTE — Patient Instructions (Addendum)
Try adding a small dose of miralax such as 1 tablespoon in juice every other day. Titrate frequency as needed to keep your bowel movements soft.  You may continue proctofoam as needed.   You may begin hydrocortisone suppositories as needed. Eat lots of fresh fruits and veggies and call if symptoms worsen or do not improve.

## 2016-01-25 NOTE — Progress Notes (Signed)
Pre visit review using our clinic review tool, if applicable. No additional management support is needed unless otherwise documented below in the visit note. 

## 2016-02-04 ENCOUNTER — Encounter: Payer: Self-pay | Admitting: Medical

## 2016-02-04 ENCOUNTER — Ambulatory Visit (INDEPENDENT_AMBULATORY_CARE_PROVIDER_SITE_OTHER): Payer: BC Managed Care – PPO | Admitting: Medical

## 2016-02-04 VITALS — BP 127/88 | HR 107 | Temp 98.4°F | Ht 65.0 in | Wt 142.8 lb

## 2016-02-04 DIAGNOSIS — R197 Diarrhea, unspecified: Secondary | ICD-10-CM | POA: Diagnosis not present

## 2016-02-04 DIAGNOSIS — M791 Myalgia, unspecified site: Secondary | ICD-10-CM

## 2016-02-04 DIAGNOSIS — R112 Nausea with vomiting, unspecified: Secondary | ICD-10-CM | POA: Diagnosis not present

## 2016-02-04 DIAGNOSIS — R51 Headache: Secondary | ICD-10-CM | POA: Diagnosis not present

## 2016-02-04 DIAGNOSIS — R519 Headache, unspecified: Secondary | ICD-10-CM

## 2016-02-04 LAB — POC INFLUENZA A&B (BINAX/QUICKVUE)
INFLUENZA A, POC: NEGATIVE
INFLUENZA B, POC: NEGATIVE

## 2016-02-04 MED ORDER — ONDANSETRON 8 MG PO TBDP: 8.0000 mg | ORAL_TABLET | Freq: Three times a day (TID) | ORAL | 0 refills | Status: DC | PRN

## 2016-02-04 NOTE — Patient Instructions (Addendum)
For your diarrhea, nausea and vomiting, I want you to hydrate well with propel and eat bland foods.  For diarrhea use imodium otc. For nausea and vomiting rx zofran.    You may have viral infection/gastroenterits  or  bacterial infection/food poisoning.(suspect viral cause more)  We did flu test since you had some ha, faint neck soreness and acute onset. Flu test was negative.  If by tomorrow you have persisting diarrhea same degree then do stool panel and lab work.  Follow up in 5 days or as needed

## 2016-02-04 NOTE — Progress Notes (Signed)
Subjective:    Patient ID: Hannah [name], female    DOB: 02-02-1981, 35 y.o.   MRN: [id number]  HPI  Pt in states all last night she had some nauseau vomiting and some diarrhea.   4 loose stools last night. Vomited 5-6 times today.  Pt states her husband felt tired and diarrhea last night. But he had no vomiting.  Both ate chicken from night before. She cooked the meal.  Has some mild ha and maybe some neck muscle soreness but not diffuse achiness.  No fevers, no chills or sweats.   Pt did get flu vaccine this year.   LMP- pt on presently.  No recent antibiotics.  Pt is a Runner, broadcasting/film/video.    Review of Systems  Constitutional: Positive for fatigue. Negative for chills, diaphoresis and fever.  HENT: Negative for congestion, ear discharge, ear pain, rhinorrhea, sinus pressure and sore throat.   Respiratory: Negative for cough, choking, shortness of breath and wheezing.   Cardiovascular: Negative for chest pain and palpitations.  Gastrointestinal: Positive for diarrhea, nausea and vomiting. Negative for abdominal distention, anal bleeding, blood in stool and constipation.       Faint transient abdomen cramps with loose stools.  Musculoskeletal: Negative for back pain and neck pain.       Faint sore neck.  Skin: Negative for rash.  Neurological: Negative for dizziness, seizures, syncope, weakness, light-headedness and numbness.       Faint ha associated vomiting per pt.  Hematological: Negative for adenopathy. Does not bruise/bleed easily.  Psychiatric/Behavioral: Negative for behavioral problems, confusion and suicidal ideas. The patient is not nervous/anxious.     Past Medical History:  Diagnosis Date  . Hypertension   . Medical history non-contributory      Social History   Social History  . Marital status: Married    Spouse name: N/A  . Number of children: N/A  . Years of education: N/A   Occupational History  . Not on file.   Social History Main Topics  .  Smoking status: Never Smoker  . Smokeless tobacco: Not on file  . Alcohol use Yes     Comment: occassioanl social drinker, not since pregnant  . Drug use: No  . Sexual activity: Not Currently   Other Topics Concern  . 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 CESAREAN SECTION N/A 08/31/2013   Procedure: CESAREAN SECTION;  Surgeon: Serita Kyle, MD;  Location: [facility];  Service: Obstetrics;  Laterality: N/A;  . MANDIBLE FRACTURE SURGERY Bilateral 2000  . NO PAST SURGERIES      Family History  Problem Relation Age of Onset  . Alcohol abuse Father   . Arthritis Father   . Hyperlipidemia Father   . Hypertension Father   . Diabetes Maternal Aunt   . Depression Paternal Aunt   . Depression Paternal Uncle   . Diabetes Maternal Grandmother   . Kidney disease Mother     Allergies  Allergen Reactions  . Tetracyclines & Related Other (See Comments)    Severe stomach cramps  . Sulfa Antibiotics Rash    After sun exposure    Current Outpatient Prescriptions on File Prior to Visit  Medication Sig Dispense Refill  . drospirenone-ethinyl estradiol (YAZ,GIANVI,LORYNA) 3-0.02 MG tablet Take 1 tablet by mouth daily.    . hydrocortisone (ANUSOL-HC) 25 MG suppository Place 1 suppository (25 mg total) rectally 2 (two) times daily as needed for hemorrhoids or itching. (Patient not taking: Reported on 02/04/2016) 24 suppository 3  . hydrocortisone-pramoxine (PROCTOFOAM HC) rectal foam Place 1 applicator rectally 2 (two) times daily. (Patient not taking: Reported on 02/04/2016) 10 g 2  . Multiple Vitamins-Minerals (MULTIVITAMIN ADULT PO) Take 1 tablet by mouth daily.     No current facility-administered medications on file prior to visit.     BP 127/88    Pulse (!) 107   Temp 98.4 F (36.9 C) (Oral)   Ht 5\' 5"  (1.651 m)   Wt 142 lb 12.8 oz (64.8 kg)   LMP 02/04/2016   SpO2 100%   BMI 23.76 kg/m       Objective:   Physical Exam  General  Mental Status - Alert. General Appearance - Well groomed. Not in acute distress.  Skin Rashes- No Rashes.  HEENT Head- Normal. Ear Auditory Canal - Left- Normal. Right - Normal.Tympanic Membrane- Left- Normal. Right- Normal. Eye Sclera/Conjunctiva- Left- Normal. Right- Normal. Nose & Sinuses Nasal Mucosa- Left- Not  Boggy and no t Congested. Right- Not   Boggy and Not   Congested.Bilateral no maxillary and no frontal sinus pressure. Mouth & Throat Lips: Upper Lip- Normal: no dryness, cracking, pallor, cyanosis, or vesicular eruption. Lower Lip-Normal: no dryness, cracking, pallor, cyanosis or vesicular eruption. Buccal Mucosa- Bilateral- No Aphthous ulcers. Oropharynx- No Discharge or Erythema. Tonsils: Characteristics- Bilateral- No Erythema or Congestion. Size/Enlargement- Bilateral- No enlargement. Discharge- bilateral-None.  Neck Neck- Supple. No Masses.   Chest and Lung Exam Auscultation: Breath Sounds:-Clear even and unlabored.  Cardiovascular Auscultation:Rythm- Regular, rate and rhythm. Murmurs & Other Heart Sounds:Ausculatation of the heart reveal- No Murmurs.  Lymphatic Head & Neck General Head & Neck Lymphatics: Bilateral: Description- No Localized lymphadenopathy.     Abdomen Inspection:-Inspection Normal.  Palpation/Perucssion: Palpation and Percussion of the abdomen reveal- Non Tender, No Rebound tenderness, No rigidity(Guarding) and No Palpable abdominal masses.  Liver:-Normal.  Spleen:- Normal.   Back-no cva tenderness.  Skin- feels moist.      Assessment & Plan:  For your diarrhea, nausea and vomiting, I want you to hydrate well with propel and eat bland foods.   For diarrhea use imodium otc. For nausea and vomiting rx zofran.    You may have  viral infection/gastroenteritis or  bacterial infection/food poisoning.(suspect viral cause more)  We did flu test since you had some ha, faint neck soreness and acute onset. Flu test was negative.  If by tomorrow you have persisting diarrhea same degree then do stool panel and lab work.  Follow up in 5 days or as needed

## 2016-02-04 NOTE — Progress Notes (Signed)
Pre visit review using our clinic tool,if applicable. No additional management support is needed unless otherwise documented below in the visit note.  

## 2016-05-02 ENCOUNTER — Telehealth: Payer: Self-pay | Admitting: Family

## 2016-05-02 NOTE — Telephone Encounter (Signed)
TELEPHONE ADVICE RECORD [facility]  Patient Name: Hannah [name]  DOB: 06/11/1980    Initial Comment caller states she has UTI sx   Nurse Assessment  Nurse: Odis LusterBowers, RN, Bjorn Loserhonda Date/Time (Eastern Time): 05/02/2016 3:40:27 PM  Confirm and document reason for call. If symptomatic, describe symptoms. ---caller states she has UTI sx. Reports that she was placed on abx since Sat for UTI. Continues to have discomfort, lower back. Possibly having some abd pain (may be menstrual cramps). Denies fever.  Does the patient have any new or worsening symptoms? ---Yes  Will a triage be completed? ---Yes  Related visit to physician within the last 2 weeks? ---Yes  Does the PT have any chronic conditions? (i.e. diabetes, asthma, etc.) ---No  Is the patient pregnant or possibly pregnant? (Ask all females between the ages of 5412-55) ---No  Is this a behavioral health or substance abuse call? ---No     Guidelines    Guideline Title Affirmed Question Affirmed Notes  Urinary Tract Infection on Antibiotic Follow-up Call - Female [1] Taking antibiotic < 72 hours (3 days) for UTI AND [2] flank or lower back pain not improved    Final Disposition User   [facility] CitronelleBowers, RN, Bjorn Loserhonda    Comments  Caller reports that she has a 5pm appt tomorrow with MD and will keep it if her symptoms are not improved by AM. Caller reports that she will call and cancel appt if not needed tomorrow.   Disagree/Comply: Comply

## 2016-05-02 NOTE — Telephone Encounter (Signed)
Pt has an appt scheduled with Dr. Zola ButtonLowne-Chase tomorrow (05/03/16) afternoon at 5:15 pm.

## 2016-05-03 ENCOUNTER — Ambulatory Visit (INDEPENDENT_AMBULATORY_CARE_PROVIDER_SITE_OTHER): Payer: BC Managed Care – PPO | Admitting: Family Medicine

## 2016-05-03 ENCOUNTER — Other Ambulatory Visit (HOSPITAL_COMMUNITY)
Admission: RE | Admit: 2016-05-03 | Discharge: 2016-05-03 | Disposition: A | Discharge status: Home / Self Care | Payer: BC Managed Care – PPO | Source: Ambulatory Visit | Attending: Family Medicine | Admitting: Family Medicine

## 2016-05-03 ENCOUNTER — Telehealth: Payer: Self-pay

## 2016-05-03 ENCOUNTER — Encounter: Payer: Self-pay | Admitting: Family Medicine

## 2016-05-03 ENCOUNTER — Other Ambulatory Visit: Payer: BC Managed Care – PPO

## 2016-05-03 VITALS — BP 130/80 | HR 86 | Temp 98.1°F | Resp 16 | Ht 65.0 in | Wt 147.8 lb

## 2016-05-03 DIAGNOSIS — N390 Urinary tract infection, site not specified: Secondary | ICD-10-CM | POA: Diagnosis not present

## 2016-05-03 DIAGNOSIS — R109 Unspecified abdominal pain: Secondary | ICD-10-CM | POA: Diagnosis not present

## 2016-05-03 LAB — POC URINALSYSI DIPSTICK (AUTOMATED)
BILIRUBIN UA: NEGATIVE
Blood, UA: NEGATIVE
CLARITY UA: NEGATIVE
GLUCOSE UA: NEGATIVE
KETONES UA: NEGATIVE
LEUKOCYTES UA: NEGATIVE
Nitrite, UA: NEGATIVE
Protein, UA: NEGATIVE
Spec Grav, UA: 1.015
Urobilinogen, UA: NEGATIVE
pH, UA: 6.5

## 2016-05-03 LAB — POCT URINE PREGNANCY: Preg Test, Ur: NEGATIVE

## 2016-05-03 NOTE — Patient Instructions (Signed)
Urinary Tract Infection, Adult Introduction A urinary tract infection (UTI) is an infection of any part of the urinary tract. The urinary tract includes the:  Kidneys.  Ureters.  Bladder.  Urethra. These organs make, store, and get rid of pee (urine) in the body. Follow these instructions at home:  Take over-the-counter and prescription medicines only as told by your doctor.  If you were prescribed an antibiotic medicine, take it as told by your doctor. Do not stop taking the antibiotic even if you start to feel better.  Avoid the following drinks:  Alcohol.  Caffeine.  Tea.  Carbonated drinks.  Drink enough fluid to keep your pee clear or pale yellow.  Keep all follow-up visits as told by your doctor. This is important.  Make sure to:  Empty your bladder often and completely. Do not to hold pee for long periods of time.  Empty your bladder before and after sex.  Wipe from front to back after a bowel movement if you are female. Use each tissue one time when you wipe. Contact a doctor if:  You have back pain.  You have a fever.  You feel sick to your stomach (nauseous).  You throw up (vomit).  Your symptoms do not get better after 3 days.  Your symptoms go away and then come back. Get help right away if:  You have very bad back pain.  You have very bad lower belly (abdominal) pain.  You are throwing up and cannot keep down any medicines or water. This information is not intended to replace advice given to you by your health care provider. Make sure you discuss any questions you have with your health care provider. Document Released: 09/14/2007 Document Revised: 09/03/2015 Document Reviewed: 02/16/2015  2017 Elsevier  

## 2016-05-03 NOTE — Telephone Encounter (Signed)
[facility] follow up call made to patient. States she has pain and pressure around ovaries with low back pain. Recently stopped Birth control. Patient has appointment scheduled to see provider.

## 2016-05-03 NOTE — Progress Notes (Signed)
Subjective:    Patient ID: Hannah [name], female    DOB: 1980/12/11, 36 y.o.   MRN: [id number]  Chief Complaint  Patient presents with  . follow up UTI    symptoms started friday and did e-visit on saturday and was started on macrobid.  still having pressure in lower abdomen and flank pain    HPI Patient is in today for follow up urinary tract infection.  Symptoms started Friday.  She did an E-Visit through [employer] on Saturday and they treated her with Macrobid.  She is now having some lower abdominal pressure and flank pain.  Past Medical History:  Diagnosis Date  . Hypertension   . Medical history non-contributory     Past Surgical History:  Procedure Laterality Date  . CESAREAN SECTION N/A 08/31/2013   Procedure: CESAREAN SECTION;  Surgeon: Serita Kyle, MD;  Location: [facility];  Service: Obstetrics;  Laterality: N/A;  . MANDIBLE FRACTURE SURGERY Bilateral 2000  . NO PAST SURGERIES      Family History  Problem Relation Age of Onset  . Alcohol abuse Father   . Arthritis Father   . Hyperlipidemia Father   . Hypertension Father   . Diabetes Maternal Aunt   . Depression Paternal Aunt   . Depression Paternal Uncle   . Diabetes Maternal Grandmother   . Kidney disease Mother     Social History   Social History  . Marital status: Married    Spouse name: N/A  . Number of children: N/A  . Years of education: N/A   Occupational History  . Not on file.   Social History Main Topics  . Smoking status: Never Smoker  . Smokeless tobacco: Never Used  . Alcohol use Yes     Comment: occassioanl social drinker, not since pregnant  . Drug use: No  . Sexual activity: Not Currently   Other Topics Concern  . 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 drospirenone-ethinyl estradiol (YAZ,GIANVI,LORYNA) 3-0.02 MG tablet Take 1 tablet by mouth daily.    . hydrocortisone (ANUSOL-HC) 25 MG suppository Place 1 suppository (25 mg total) rectally 2 (two) times daily as needed for hemorrhoids or itching. (Patient not taking: Reported on 02/04/2016) 24 suppository 3  . hydrocortisone-pramoxine (PROCTOFOAM HC) rectal foam Place 1 applicator rectally 2 (two) times daily. (Patient not taking: Reported on 02/04/2016) 10 g 2  . Multiple Vitamins-Minerals (MULTIVITAMIN ADULT PO) Take 1 tablet by mouth daily.    . ondansetron (ZOFRAN ODT) 8 MG disintegrating tablet Take 1 tablet (8 mg total) by mouth every 8 (eight) hours as needed for nausea or vomiting. 20 tablet 0   No facility-administered medications prior to visit.     Allergies  Allergen Reactions  . Tetracyclines & Related Other (See Comments)    Severe stomach cramps  . Sulfa Antibiotics Rash    After sun exposure    Review of Systems  Constitutional: Negative for fever and malaise/fatigue.  HENT: Negative for congestion.   Eyes: Negative for blurred vision.  Respiratory: Negative for cough and shortness of breath.   Cardiovascular: Negative for chest pain, palpitations and leg swelling.  Gastrointestinal: Negative for  vomiting.  Musculoskeletal: Negative for back pain.  Skin: Negative for rash.  Neurological: Negative for loss of consciousness and headaches.       Objective:    Physical Exam  Constitutional: She is oriented to person, place, and time. She appears well-developed and well-nourished. No distress.  HENT:  Head: Normocephalic and atraumatic.  Eyes: Conjunctivae are normal.  Neck: Normal range of motion. No thyromegaly present.  Cardiovascular: Normal rate and regular rhythm.   Pulmonary/Chest: Effort normal and breath sounds normal. She has no wheezes.  Abdominal: Soft. Bowel sounds are normal. There is no tenderness.   Musculoskeletal: Normal range of motion. She exhibits no edema or deformity.  Neurological: She is alert and oriented to person, place, and time.  Skin: Skin is warm and dry. She is not diaphoretic.  Psychiatric: She has a normal mood and affect. Her behavior is normal. Judgment and thought content normal.  Nursing note and vitals reviewed.   BP 130/80 (BP Location: Left Arm, Cuff Size: Normal)   Pulse 86   Temp 98.1 F (36.7 C) (Oral)   Resp 16   Ht 5\' 5"  (1.651 m)   Wt 147 lb 12.8 oz (67 kg)   LMP 04/15/2016   SpO2 99%   BMI 24.60 kg/m  Wt Readings from Last 3 Encounters:  05/03/16 147 lb 12.8 oz (67 kg)  02/04/16 142 lb 12.8 oz (64.8 kg)  01/25/16 147 lb 6.4 oz (66.9 kg)     Lab Results  Component Value Date   WBC 7.9 05/15/2015   HGB 12.7 05/15/2015   HCT 38.0 05/15/2015   PLT 236.0 05/15/2015   GLUCOSE 89 05/15/2015   CHOL 194 05/15/2015   TRIG 111.0 05/15/2015   HDL 75.30 05/15/2015   LDLCALC 97 05/15/2015   ALT 11 05/15/2015   AST 12 05/15/2015   NA 140 05/15/2015   K 4.0 05/15/2015   CL 106 05/15/2015   CREATININE 0.81 05/15/2015   BUN 14 05/15/2015   CO2 28 05/15/2015   TSH 2.02 05/15/2015    Lab Results  Component Value Date   TSH 2.02 05/15/2015   Lab Results  Component Value Date   WBC 7.9 05/15/2015   HGB 12.7 05/15/2015   HCT 38.0 05/15/2015   MCV 88.5 05/15/2015   PLT 236.0 05/15/2015   Lab Results  Component Value Date   NA 140 05/15/2015   K 4.0 05/15/2015   CO2 28 05/15/2015   GLUCOSE 89 05/15/2015   BUN 14 05/15/2015   CREATININE 0.81 05/15/2015   BILITOT 0.4 05/15/2015   ALKPHOS 36 (L) 05/15/2015   AST 12 05/15/2015   ALT 11 05/15/2015   PROT 7.4 05/15/2015   ALBUMIN 4.0 05/15/2015   CALCIUM 9.1 05/15/2015   GFR 86.15 05/15/2015   Lab Results  Component Value Date   CHOL 194 05/15/2015   Lab Results  Component Value Date   HDL 75.30 05/15/2015   Lab Results  Component Value Date   LDLCALC 97 05/15/2015   Lab  Results  Component Value Date   TRIG 111.0 05/15/2015   Lab Results  Component Value Date   CHOLHDL 3 05/15/2015   No results found for: HGBA1C     Assessment & Plan:   Problem List Items Addressed This Visit      Unprioritized   Abdominal pain - Primary   Relevant Orders   POCT Urinalysis Dipstick (Automated) (Completed)   Urine Culture   POCT urine pregnancy (Completed)   Urine cytology  ancillary only   Urinary tract infection without hematuria   Relevant Medications   nitrofurantoin, macrocrystal-monohydrate, (MACROBID) 100 MG capsule   Other Relevant Orders   Urine Culture      I have discontinued Ms. Milham's drospirenone-ethinyl estradiol, Multiple Vitamins-Minerals (MULTIVITAMIN ADULT PO), hydrocortisone-pramoxine, hydrocortisone, and ondansetron. I am also having her maintain her NIKKI and nitrofurantoin (macrocrystal-monohydrate).  Meds ordered this encounter  Medications  . NIKKI 3-0.02 MG tablet    Sig: TK 1 T PO D    Refill:  12  . nitrofurantoin, macrocrystal-monohydrate, (MACROBID) 100 MG capsule    Sig: TK 1 C PO BID FOR 5 DAYS    Refill:  0    CMA served as scribe during this visit. History, Physical and Plan performed by medical provider. Documentation and orders reviewed and attested to.   Donato Schultz, DO

## 2016-05-03 NOTE — Progress Notes (Signed)
Pre visit review using our clinic review tool, if applicable. No additional management support is needed unless otherwise documented below in the visit note. 

## 2016-05-04 DIAGNOSIS — R109 Unspecified abdominal pain: Secondary | ICD-10-CM | POA: Insufficient documentation

## 2016-05-04 DIAGNOSIS — N390 Urinary tract infection, site not specified: Secondary | ICD-10-CM | POA: Insufficient documentation

## 2016-05-04 LAB — URINE CULTURE: Organism ID, Bacteria: NO GROWTH

## 2016-05-04 NOTE — Assessment & Plan Note (Signed)
Suprapubic pressure May be due to uti Culture pending Pain improved from UC visit

## 2016-05-04 NOTE — Assessment & Plan Note (Signed)
On macrobid Culture pending  rto prn

## 2016-05-05 LAB — URINE CYTOLOGY ANCILLARY ONLY: TRICH (WINDOWPATH): NEGATIVE

## 2016-05-10 LAB — URINE CYTOLOGY ANCILLARY ONLY
Bacterial vaginitis: NEGATIVE
Candida vaginitis: NEGATIVE

## 2016-11-18 ENCOUNTER — Ambulatory Visit (INDEPENDENT_AMBULATORY_CARE_PROVIDER_SITE_OTHER): Payer: BC Managed Care – PPO | Admitting: Family

## 2016-11-18 ENCOUNTER — Other Ambulatory Visit (HOSPITAL_COMMUNITY)
Admission: RE | Admit: 2016-11-18 | Discharge: 2016-11-18 | Disposition: A | Discharge status: Home / Self Care | Payer: BC Managed Care – PPO | Source: Ambulatory Visit | Attending: Family | Admitting: Family

## 2016-11-18 ENCOUNTER — Encounter: Payer: Self-pay | Admitting: Family

## 2016-11-18 VITALS — BP 139/96 | HR 100 | Temp 97.8°F | Resp 16 | Ht 65.0 in | Wt 151.8 lb

## 2016-11-18 DIAGNOSIS — N898 Other specified noninflammatory disorders of vagina: Secondary | ICD-10-CM | POA: Diagnosis not present

## 2016-11-18 DIAGNOSIS — R350 Frequency of micturition: Secondary | ICD-10-CM

## 2016-11-18 LAB — POCT URINALYSIS DIPSTICK
Bilirubin, UA: NEGATIVE
Blood, UA: NEGATIVE
Glucose, UA: NEGATIVE
Ketones, UA: NEGATIVE
LEUKOCYTES UA: NEGATIVE
Nitrite, UA: NEGATIVE
PROTEIN UA: NEGATIVE
Spec Grav, UA: 1.015 (ref 1.010–1.025)
Urobilinogen, UA: 0.2 E.U./dL
pH, UA: 6.5 (ref 5.0–8.0)

## 2016-11-18 NOTE — Patient Instructions (Signed)
We will let you know how your urine culture and swab turn out.

## 2016-11-18 NOTE — Progress Notes (Signed)
Subjective:    Patient ID: Hannah [name], female    DOB: 03/06/1981, 36 y.o.   MRN: [id number]  HPI   Hannah [name] is a 36 yr old female who presents today with c/o urinary frequency x 2 days.  Also having white vaginal discharge.   LMP was 7/12.  She is trying to get pregnant.  Had a lot of discharge yesterday.  Hasn't noticed any discharge today.   Review of Systems See HPI  Past Medical History:  Diagnosis Date  . Hypertension   . Medical history non-contributory      Social History   Social History  . Marital status: Married    Spouse name: N/A  . Number of children: N/A  . Years of education: N/A   Occupational History  . Not on file.   Social History Main Topics  . Smoking status: Never Smoker  . Smokeless tobacco: Never Used  . Alcohol use Yes     Comment: occassioanl social drinker, not since pregnant  . Drug use: No  . Sexual activity: Not Currently   Other Topics Concern  . 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 CESAREAN SECTION N/A 08/31/2013   Procedure: CESAREAN SECTION;  Surgeon: Serita KyleSheronette A Cousins, MD;  Location: [facility];  Service: Obstetrics;  Laterality: N/A;  . MANDIBLE FRACTURE SURGERY Bilateral 2000  . NO PAST SURGERIES      Family History  Problem Relation Age of Onset  . Alcohol abuse Father   . Arthritis Father   . Hyperlipidemia Father   . Hypertension Father   . Diabetes Maternal Aunt   . Depression Paternal Aunt   . Depression Paternal Uncle   . Diabetes Maternal Grandmother   . Kidney disease Mother     Allergies  Allergen Reactions  . Tetracyclines & Related Other (See Comments)    Severe stomach cramps  . Sulfa Antibiotics Rash    After sun exposure    No current outpatient  prescriptions on file prior to visit.   No current facility-administered medications on file prior to visit.     BP (!) 139/96 (BP Location: Right Arm, Cuff Size: Normal)   Pulse 100   Temp 97.8 F (36.6 C) (Oral)   Resp 16   Ht 5\' 5"  (1.651 m)   Wt 151 lb 12.8 oz (68.9 kg)   LMP 10/20/2016   SpO2 100%   BMI 25.26 kg/m       Objective:   Physical Exam  Constitutional: She is oriented to person, place, and time. She appears well-developed and well-nourished.  HENT:  Head: Normocephalic and atraumatic.  Cardiovascular: Normal rate, regular rhythm and normal heart sounds.   No murmur heard. Pulmonary/Chest: Effort normal and breath sounds normal. No respiratory distress. She has no wheezes.  Genitourinary:  Genitourinary Comments: Small amount of white vaginal discharge is noted.  Musculoskeletal: She exhibits no edema.  Neurological: She is alert and oriented to person, place, and time.  Skin: Skin is warm.  Psychiatric: She has a normal mood and affect. Her behavior is normal. Judgment and thought content normal.          Assessment & Plan:  Urinary  frequency-urinalysis is unremarkable. Will send for culture. Vaginal swab is also performed today and will be sent for yeast and bacterial vaginosis. A urine pregnancy test is performed and is negative.

## 2016-11-18 NOTE — Addendum Note (Signed)
Addended by: Mervin KungFERGERSON, Aaliyan Brinkmeier A on: 11/18/2016 04:42 PM   Modules accepted: Orders

## 2016-11-19 LAB — URINE CULTURE: Organism ID, Bacteria: NO GROWTH

## 2016-11-23 LAB — CERVICOVAGINAL ANCILLARY ONLY
BACTERIAL VAGINITIS: NEGATIVE
CANDIDA VAGINITIS: NEGATIVE

## 2017-03-01 LAB — OB RESULTS CONSOLE ABO/RH: RH Type: POSITIVE

## 2017-03-01 LAB — OB RESULTS CONSOLE GC/CHLAMYDIA
Chlamydia: NEGATIVE
GC PROBE AMP, GENITAL: NEGATIVE

## 2017-03-01 LAB — OB RESULTS CONSOLE HEPATITIS B SURFACE ANTIGEN: Hepatitis B Surface Ag: NEGATIVE

## 2017-03-01 LAB — OB RESULTS CONSOLE ANTIBODY SCREEN: ANTIBODY SCREEN: NEGATIVE

## 2017-03-01 LAB — OB RESULTS CONSOLE HIV ANTIBODY (ROUTINE TESTING): HIV: NONREACTIVE

## 2017-03-01 LAB — OB RESULTS CONSOLE RUBELLA ANTIBODY, IGM: Rubella: IMMUNE

## 2017-03-01 LAB — OB RESULTS CONSOLE RPR: RPR: NONREACTIVE

## 2017-03-06 ENCOUNTER — Other Ambulatory Visit (HOSPITAL_COMMUNITY): Payer: Self-pay | Admitting: Obstetrics & Gynecology

## 2017-03-06 DIAGNOSIS — N883 Incompetence of cervix uteri: Secondary | ICD-10-CM

## 2017-03-06 DIAGNOSIS — O09299 Supervision of pregnancy with other poor reproductive or obstetric history, unspecified trimester: Secondary | ICD-10-CM

## 2017-03-06 DIAGNOSIS — Z3A16 16 weeks gestation of pregnancy: Secondary | ICD-10-CM

## 2017-03-06 DIAGNOSIS — Z8751 Personal history of pre-term labor: Secondary | ICD-10-CM

## 2017-03-26 ENCOUNTER — Other Ambulatory Visit: Payer: Self-pay | Admitting: Family

## 2017-04-05 ENCOUNTER — Other Ambulatory Visit: Payer: Self-pay | Admitting: Obstetrics & Gynecology

## 2017-04-11 HISTORY — DX: Maternal care for unspecified type scar from previous cesarean delivery: O34.219

## 2017-04-13 ENCOUNTER — Encounter (HOSPITAL_COMMUNITY): Payer: Self-pay

## 2017-04-14 ENCOUNTER — Encounter (HOSPITAL_COMMUNITY): Payer: BC Managed Care – PPO

## 2017-04-14 ENCOUNTER — Encounter (HOSPITAL_COMMUNITY)
Admission: RE | Disposition: A | Discharge status: Home / Self Care | Payer: Self-pay | Source: Ambulatory Visit | Attending: Obstetrics & Gynecology

## 2017-04-14 ENCOUNTER — Other Ambulatory Visit: Payer: Self-pay

## 2017-04-14 ENCOUNTER — Other Ambulatory Visit (HOSPITAL_COMMUNITY): Payer: BC Managed Care – PPO

## 2017-04-14 ENCOUNTER — Encounter (HOSPITAL_COMMUNITY): Payer: Self-pay

## 2017-04-14 ENCOUNTER — Ambulatory Visit (HOSPITAL_COMMUNITY): Payer: BC Managed Care – PPO | Admitting: Anesthesiology

## 2017-04-14 ENCOUNTER — Ambulatory Visit (HOSPITAL_COMMUNITY)
Admission: RE | Admit: 2017-04-14 | Discharge: 2017-04-14 | Disposition: A | Discharge status: Home / Self Care | Payer: BC Managed Care – PPO | Source: Ambulatory Visit | Attending: Obstetrics & Gynecology | Admitting: Obstetrics & Gynecology

## 2017-04-14 DIAGNOSIS — Z882 Allergy status to sulfonamides status: Secondary | ICD-10-CM | POA: Diagnosis not present

## 2017-04-14 DIAGNOSIS — O09522 Supervision of elderly multigravida, second trimester: Secondary | ICD-10-CM | POA: Insufficient documentation

## 2017-04-14 DIAGNOSIS — O3432 Maternal care for cervical incompetence, second trimester: Secondary | ICD-10-CM | POA: Diagnosis present

## 2017-04-14 DIAGNOSIS — Z3A16 16 weeks gestation of pregnancy: Secondary | ICD-10-CM | POA: Diagnosis not present

## 2017-04-14 DIAGNOSIS — O162 Unspecified maternal hypertension, second trimester: Secondary | ICD-10-CM | POA: Insufficient documentation

## 2017-04-14 DIAGNOSIS — Z881 Allergy status to other antibiotic agents status: Secondary | ICD-10-CM | POA: Diagnosis not present

## 2017-04-14 DIAGNOSIS — O09892 Supervision of other high risk pregnancies, second trimester: Secondary | ICD-10-CM

## 2017-04-14 DIAGNOSIS — O09212 Supervision of pregnancy with history of pre-term labor, second trimester: Secondary | ICD-10-CM | POA: Insufficient documentation

## 2017-04-14 HISTORY — DX: Maternal care for cervical incompetence, second trimester: O34.32

## 2017-04-14 HISTORY — DX: Supervision of pregnancy with history of pre-term labor, second trimester: O09.212

## 2017-04-14 HISTORY — PX: CERVICAL CERCLAGE: SHX1329

## 2017-04-14 HISTORY — DX: Supervision of other high risk pregnancies, second trimester: O09.892

## 2017-04-14 LAB — CBC
HEMATOCRIT: 36.5 % (ref 36.0–46.0)
Hemoglobin: 12.4 g/dL (ref 12.0–15.0)
MCH: 30.3 pg (ref 26.0–34.0)
MCHC: 34 g/dL (ref 30.0–36.0)
MCV: 89.2 fL (ref 78.0–100.0)
PLATELETS: 217 10*3/uL (ref 150–400)
RBC: 4.09 MIL/uL (ref 3.87–5.11)
RDW: 13.4 % (ref 11.5–15.5)
WBC: 11.5 10*3/uL — ABNORMAL HIGH (ref 4.0–10.5)

## 2017-04-14 SURGERY — CERCLAGE, CERVIX, VAGINAL APPROACH
Anesthesia: Spinal | Site: Vagina

## 2017-04-14 MED ORDER — BUPIVACAINE IN DEXTROSE 0.75-8.25 % IT SOLN
INTRATHECAL | Status: AC
  Filled 2017-04-14: qty 2

## 2017-04-14 MED ORDER — KETOROLAC TROMETHAMINE 30 MG/ML IJ SOLN
30.0000 mg | Freq: Once | INTRAMUSCULAR | Status: DC | PRN
  Administered 2017-04-14: 30 mg via INTRAVENOUS

## 2017-04-14 MED ORDER — CEFAZOLIN SODIUM-DEXTROSE 2-4 GM/100ML-% IV SOLN
2.0000 g | INTRAVENOUS | Status: AC
  Administered 2017-04-14: 2 g via INTRAVENOUS
  Filled 2017-04-14: qty 100

## 2017-04-14 MED ORDER — OXYCODONE HCL 5 MG PO TABS: 5.0000 mg | ORAL_TABLET | Freq: Once | ORAL | Status: DC | PRN

## 2017-04-14 MED ORDER — BUPIVACAINE IN DEXTROSE 0.75-8.25 % IT SOLN
INTRATHECAL | Status: DC | PRN
  Administered 2017-04-14: 1.1 mL via INTRATHECAL

## 2017-04-14 MED ORDER — LACTATED RINGERS IV SOLN
INTRAVENOUS | Status: DC
  Administered 2017-04-14: 125 mL via INTRAVENOUS
  Administered 2017-04-14: 125 mL/h via INTRAVENOUS

## 2017-04-14 MED ORDER — ACETAMINOPHEN 325 MG PO TABS
325.0000 mg | ORAL_TABLET | ORAL | Status: DC | PRN
  Administered 2017-04-14 (×2): 325 mg via ORAL

## 2017-04-14 MED ORDER — ACETAMINOPHEN 325 MG PO TABS
ORAL_TABLET | ORAL | Status: AC
  Filled 2017-04-14: qty 1

## 2017-04-14 MED ORDER — MEPERIDINE HCL 25 MG/ML IJ SOLN: 6.2500 mg | INTRAMUSCULAR | Status: DC | PRN

## 2017-04-14 MED ORDER — ONDANSETRON HCL 4 MG/2ML IJ SOLN
4.0000 mg | Freq: Once | INTRAMUSCULAR | Status: AC | PRN
  Administered 2017-04-14: 4 mg via INTRAVENOUS

## 2017-04-14 MED ORDER — OXYCODONE HCL 5 MG/5ML PO SOLN: 5.0000 mg | Freq: Once | ORAL | Status: DC | PRN

## 2017-04-14 MED ORDER — PROMETHAZINE HCL 25 MG/ML IJ SOLN
INTRAMUSCULAR | Status: AC
  Administered 2017-04-14: 6.25 mg via INTRAVENOUS
  Filled 2017-04-14: qty 1

## 2017-04-14 MED ORDER — ONDANSETRON HCL 4 MG/2ML IJ SOLN
INTRAMUSCULAR | Status: AC
  Filled 2017-04-14: qty 2

## 2017-04-14 MED ORDER — ACETAMINOPHEN 325 MG PO TABS
ORAL_TABLET | ORAL | Status: AC
Start: 2017-04-14 — End: 2017-04-14
  Administered 2017-04-14: 325 mg via ORAL
  Filled 2017-04-14: qty 1

## 2017-04-14 MED ORDER — PROMETHAZINE HCL 25 MG/ML IJ SOLN
6.2500 mg | INTRAMUSCULAR | Status: AC | PRN
  Administered 2017-04-14 (×2): 6.25 mg via INTRAVENOUS

## 2017-04-14 MED ORDER — FENTANYL CITRATE (PF) 100 MCG/2ML IJ SOLN: 25.0000 ug | INTRAMUSCULAR | Status: DC | PRN

## 2017-04-14 MED ORDER — CEFAZOLIN SODIUM-DEXTROSE 2-3 GM-%(50ML) IV SOLR
INTRAVENOUS | Status: AC
  Filled 2017-04-14: qty 50

## 2017-04-14 MED ORDER — KETOROLAC TROMETHAMINE 30 MG/ML IJ SOLN
INTRAMUSCULAR | Status: AC
  Filled 2017-04-14: qty 1

## 2017-04-14 MED ORDER — ACETAMINOPHEN 160 MG/5ML PO SOLN: 325.0000 mg | ORAL | Status: DC | PRN

## 2017-04-14 SURGICAL SUPPLY — 17 items
CANISTER SUCT 3000ML PPV (MISCELLANEOUS) ×3 IMPLANT
GLOVE BIO SURGEON STRL SZ7 (GLOVE) ×3 IMPLANT
GLOVE BIOGEL PI IND STRL 7.0 (GLOVE) ×1 IMPLANT
GLOVE BIOGEL PI INDICATOR 7.0 (GLOVE) ×2
GOWN STRL REUS W/TWL LRG LVL3 (GOWN DISPOSABLE) ×6 IMPLANT
NEEDLE MAYO CATGUT SZ4 (NEEDLE) ×3 IMPLANT
NS IRRIG 1000ML POUR BTL (IV SOLUTION) ×3 IMPLANT
PACK VAGINAL MINOR WOMEN LF (CUSTOM PROCEDURE TRAY) ×3 IMPLANT
PAD OB MATERNITY 4.3X12.25 (PERSONAL CARE ITEMS) ×3 IMPLANT
PAD PREP 24X48 CUFFED NSTRL (MISCELLANEOUS) ×3 IMPLANT
SUT PROLENE 1 CT 1 30 (SUTURE) ×3 IMPLANT
SYR BULB IRRIGATION 50ML (SYRINGE) ×3 IMPLANT
TOWEL OR 17X24 6PK STRL BLUE (TOWEL DISPOSABLE) ×6 IMPLANT
TRAY FOLEY CATH SILVER 14FR (SET/KITS/TRAYS/PACK) ×3 IMPLANT
TUBING NON-CON 1/4 X 20 CONN (TUBING) ×2 IMPLANT
TUBING NON-CON 1/4 X 20' CONN (TUBING) ×1
YANKAUER SUCT BULB TIP NO VENT (SUCTIONS) ×3 IMPLANT

## 2017-04-14 NOTE — Transfer of Care (Signed)
Immediate Anesthesia Transfer of Care Note  Patient: Hannah [name]  Procedure(s) Performed: CERCLAGE CERVICAL (N/A Vagina )  Patient Location: PACU  Anesthesia Type:Spinal  Level of Consciousness: awake, alert  and oriented  Airway & Oxygen Therapy: Patient Spontanous Breathing  Post-op Assessment: Report given to RN and Post -op Vital signs reviewed and stable  Post vital signs: Reviewed and stable  Last Vitals:  Vitals:   04/14/17 1152  BP: 112/87  Pulse: 92  Resp: 16  Temp: 36.8 C  SpO2: 100%    Last Pain:  Vitals:   04/14/17 1152  TempSrc: Oral      Patients Stated Pain Goal: 5 (04/14/17 1152)  Complications: No apparent anesthesia complications

## 2017-04-14 NOTE — Op Note (Signed)
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 Understands and agrees and consents.  Patient was brought to the operating room with IV running. She received preop 2 gm Ancef. She underwent Spinal anesthesia without complications. She was given dorsolithotomy position. Parts were prepped and draped in standard fashion. Bladder was catheterized with foley. Speculum was placed and cervix was evaluated. External os appeared normal and closed and cervix appeared normal 3 cm but soft with cervical mucus at the os. Bladder reflection at cervical vaginal junction evaluated. Cervix grasped with 2 Ring forceps and vaginal walls retracted with Deaver. 0- Prolene used for cerclage, starting at 1 o'clock and in anti-clockwise fashion taking purse string sutures through the body of the cervix and coming out again at 1 o'clock and sutured tied off and 8 knots and again with 5 knots after creating a loop to identify it through the pregnancy. Bleeding was well controlled after the stitch was completed and tied. No active bleeding noted, no loss of fluid or bleeding noted from the internal os.  Hemostasis excellent. Instruments removed. All counts correct x2.   Patient will be discharged home today. Warning signs of infection and excessive bleeding and miscarriage precautions reviewed. Follow up in office in 2 weeks and plan cervical length with sono at the visit. Precautions reviewed.   I performed the procedure.  Shea Evans- Jancie Kercher, MD.

## 2017-04-14 NOTE — Anesthesia Postprocedure Evaluation (Signed)
Anesthesia Post Note  Patient: Hannah [name]  Procedure(s) Performed: CERCLAGE CERVICAL (N/A Vagina )     Patient location during evaluation: PACU Anesthesia Type: Spinal Level of consciousness: oriented and awake and alert Pain management: pain level controlled Vital Signs Assessment: post-procedure vital signs reviewed and stable Respiratory status: spontaneous breathing, respiratory function stable and patient connected to nasal cannula oxygen Cardiovascular status: blood pressure returned to baseline and stable Postop Assessment: no headache, no backache and no apparent nausea or vomiting Anesthetic complications: no    Last Vitals:  Vitals:   04/14/17 1500 04/14/17 1515  BP: 112/89 117/88  Pulse:    Resp: 15 17  Temp:    SpO2:      Last Pain:  Vitals:   04/14/17 1152  TempSrc: Oral   Pain Goal: Patients Stated Pain Goal: 5 (04/14/17 1152)               Londyn Hotard

## 2017-04-14 NOTE — Anesthesia Preprocedure Evaluation (Signed)
Anesthesia Evaluation  Patient identified by MRN, date of birth, ID band Patient awake    Reviewed: Allergy & Precautions, H&P , NPO status , Patient's Chart, lab work & pertinent test results, reviewed documented beta blocker date and time   Airway Mallampati: III  TM Distance: >3 FB Neck ROM: Full    Dental no notable dental hx. (+) Teeth Intact   Pulmonary neg pulmonary ROS,    Pulmonary exam normal breath sounds clear to auscultation       Cardiovascular hypertension, Pt. on medications negative cardio ROS Normal cardiovascular exam Rhythm:Regular Rate:Normal     Neuro/Psych negative neurological ROS  negative psych ROS   GI/Hepatic negative GI ROS, Neg liver ROS,   Endo/Other  negative endocrine ROS  Renal/GU negative Renal ROS  negative genitourinary   Musculoskeletal negative musculoskeletal ROS (+)   Abdominal   Peds  Hematology negative hematology ROS (+)   Anesthesia Other Findings   Reproductive/Obstetrics (+) Pregnancy Twin gestation 6227 weeks SROM                             Anesthesia Physical  Anesthesia Plan  ASA: II and emergent  Anesthesia Plan: Spinal   Post-op Pain Management:    Induction:   PONV Risk Score and Plan:   Airway Management Planned: Natural Airway  Additional Equipment:   Intra-op Plan:   Post-operative Plan:   Informed Consent: I have reviewed the patients History and Physical, chart, labs and discussed the procedure including the risks, benefits and alternatives for the proposed anesthesia with the patient or authorized representative who has indicated his/her understanding and acceptance.     Plan Discussed with: Anesthesiologist, CRNA and Surgeon  Anesthesia Plan Comments:         Anesthesia Quick Evaluation

## 2017-04-14 NOTE — Anesthesia Procedure Notes (Signed)
Spinal  Patient location during procedure: OR Start time: 04/14/2017 1:39 PM End time: 04/14/2017 1:42 PM Staffing Anesthesiologist: Bethena Midgetddono, Severina Sykora, MD Preanesthetic Checklist Completed: patient identified, site marked, surgical consent, pre-op evaluation, timeout performed, IV checked, risks and benefits discussed and monitors and equipment checked Spinal Block Patient position: sitting Prep: DuraPrep Patient monitoring: heart rate, cardiac monitor, continuous pulse ox and blood pressure Approach: midline Location: L3-4 Injection technique: single-shot Needle Needle type: Sprotte  Needle gauge: 24 G Needle length: 9 cm Assessment Sensory level: T10

## 2017-04-14 NOTE — H&P (Signed)
Hannah [name] is an 37 y.o. female 432P0101. 16.1 weeks by 1st trim sono. Here for prophylactic McDonald cervical cerclage. G1- 2015- spontaneous twins. Incidental cervical funelling noted at 26 wks and was admitted and went into preterm labor at 28 wks with emergency low vertical c/section, one twin died few hours after birth, other girl surviving and well.   Current is singleton spontaneous pregnancy. AMA. Pt deferred cerclage until Jan'19 due to high deductible last yr, so CL was done at 15 was 4 cm and 4.6 cm at 16 wks yesterday with no funelling.  PNLabs normal. Panorama normal/ low risk. AFP1 to be done.  Remote h/o HTN, no meds since last delivery., normal BPs  No LMP recorded.    Past Medical History:  Diagnosis Date  . Hypertension   . Medical history non-contributory     Past Surgical History:  Procedure Laterality Date  . CESAREAN SECTION N/A 08/31/2013   Procedure: CESAREAN SECTION;  Surgeon: Serita KyleSheronette A Cousins, MD;  Location: [facility];  Service: Obstetrics;  Laterality: N/A;  . MANDIBLE FRACTURE SURGERY Bilateral 2000  . NO PAST SURGERIES      Family History  Problem Relation Age of Onset  . Alcohol abuse Father   . Arthritis Father   . Hyperlipidemia Father   . Hypertension Father   . Diabetes Maternal Aunt   . Diabetes Maternal Grandmother   . Depression Paternal Aunt   . Depression Paternal Uncle   . Kidney disease Mother     Social History:  reports that  has never smoked. she has never used smokeless tobacco. She reports that she drinks alcohol. She reports that she does not use drugs.  Allergies:  Allergies  Allergen Reactions  . Tetracyclines & Related Other (See Comments)    Severe stomach cramps  . Sulfa Antibiotics Rash    After sun exposure    Medications Prior to Admission  Medication Sig Dispense Refill Last Dose  . ANUCORT-HC 25 MG suppository UNWRAP AND INSERT 1 SUPPOSITORY(25 MG) RECTALLY TWICE DAILY AS NEEDED FOR HEMORRHOIDS OR ITCHING  24 suppository 0 Past Week at Unknown time  . [facility] FIBER GUMMIES PO Take 2 tablets by mouth daily.   04/13/2017 at Unknown time  . Magnesium 250 MG TABS Take 250 mg by mouth at bedtime.   04/13/2017 at Unknown time  . Prenatal Vit-Fe Fumarate-FA (PRENATAL MULTIVITAMIN) TABS tablet Take 1 tablet by mouth daily.   04/13/2017 at Unknown time  . PROCTOFOAM HC rectal foam APPLY RECTALLY TWICE DAILY (Patient not taking: Reported on 04/06/2017) 10 g 0 Not Taking at Unknown time    ROS  Blood pressure 112/87, pulse 92, temperature 98.3 F (36.8 C), temperature source Oral, resp. rate 16, height 5\' 5"  (1.651 m), weight 156 lb (70.8 kg), SpO2 100 %, unknown if currently breastfeeding. Physical Exam Physical exam:  A&O x 3, no acute distress. Pleasant HEENT neg, no thyromegaly Lungs CTA bilat CV RRR, S1S2 normal Abdo soft, non tender, non acute Extr no edema/ tenderness Pelvic cx closed, long  FHT  140s Toco none   Results for orders placed or performed during the hospital encounter of 04/14/17 (from the past 24 hour(s))  CBC     Status: Abnormal   Collection Time: 04/14/17 11:45 AM  Result Value Ref Range   WBC 11.5 (H) 4.0 - 10.5 K/uL   RBC 4.09 3.87 - 5.11 MIL/uL   Hemoglobin 12.4 12.0 - 15.0 g/dL   HCT 04.536.5 40.936.0 - 81.146.0 %  MCV 89.2 78.0 - 100.0 fL   MCH 30.3 26.0 - 34.0 pg   MCHC 34.0 30.0 - 36.0 g/dL   RDW 16.1 09.6 - 04.5 %   Platelets 217 150 - 400 K/uL    No results found.  Assessment/Plan: 37 yo at 16 wks with h/o incompetent cervix and twin preterm delivery. Here for Cervical cerclage.  Will also start 17-OhP/ Makena from 17 wks till 36 wks for preterm prevention  Risks/complications of surgery reviewed incl infection, bleeding, damage to internal organs including bladder, bowels, ureters, blood vessels, other risks from anesthesia, VTE and delayed complications of any surgery, complications in future surgery reviewed. Also discussed failed procedure and loss of pregnancy from  cerclage complications.   Robley Fries 04/14/2017, 12:48 PM

## 2017-04-15 ENCOUNTER — Encounter (HOSPITAL_COMMUNITY): Payer: Self-pay | Admitting: Obstetrics & Gynecology

## 2017-06-19 ENCOUNTER — Encounter: Payer: Self-pay | Admitting: Family

## 2017-06-19 ENCOUNTER — Ambulatory Visit: Payer: BC Managed Care – PPO | Admitting: Family

## 2017-06-19 VITALS — BP 125/72 | HR 88 | Temp 97.8°F | Resp 18 | Ht 65.0 in | Wt 162.6 lb

## 2017-06-19 DIAGNOSIS — H1033 Unspecified acute conjunctivitis, bilateral: Secondary | ICD-10-CM

## 2017-06-19 MED ORDER — ERYTHROMYCIN 5 MG/GM OP OINT: 1.0000 "application " | TOPICAL_OINTMENT | Freq: Four times a day (QID) | OPHTHALMIC | 0 refills | Status: AC

## 2017-06-19 NOTE — Patient Instructions (Signed)
Begin erythromycin ointment for conjunctivitis. Stay well hydrated.   You may use mucinex as needed for congestion as well as claritin or benadryl. Tylenol as needed for pain. Call if symptoms worsen or if they fail to improve in the next few days.

## 2017-06-19 NOTE — Progress Notes (Signed)
Subjective:    Patient ID: Hannah HeapAnna L [name], female    DOB: 09/16/1980, 37 y.o.   MRN: [id number]  HPI   Pt is a 37 yr old pregnant female who presents today with c/o Cough/drainage- started Friday (sore throat), yesterday sore throat/drainage, Last night developed eye discharge.  + cough. Feels like she has a cold- mainly worried about the eye discharge.   Review of Systems See HPI  Past Medical History:  Diagnosis Date  . History of preterm delivery, currently pregnant, second trimester 04/14/2017  . Hypertension   . Incompetent cervix in pregnancy, antepartum, second trimester 04/14/2017  . Medical history non-contributory      Social History   Socioeconomic History  . Marital status: Married    Spouse name: Not on file  . Number of children: Not on file  . Years of education: Not on file  . Highest education level: Not on file  Social Needs  . Financial resource strain: Not on file  . Food insecurity - worry: Not on file  . Food insecurity - inability: Not on file  . Transportation needs - medical: Not on file  . Transportation needs - non-medical: Not on file  Occupational History  . Not on file  Tobacco Use  . Smoking status: Never Smoker  . Smokeless tobacco: Never Used  Substance and Sexual Activity  . Alcohol use: Yes    Comment: occassioanl social drinker, not since pregnant  . Drug use: No  . Sexual activity: Not Currently  Other Topics Concern  . 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 CERVICAL CERCLAGE N/A 04/14/2017   Procedure: CERCLAGE CERVICAL;  Surgeon: Shea EvansMody, Vaishali, MD;  Location: [facility];  Service: Gynecology;  Laterality: N/A;  EDD: 09/28/17  . CESAREAN SECTION N/A 08/31/2013   Procedure: CESAREAN SECTION;  Surgeon:  Serita KyleSheronette A Cousins, MD;  Location: [facility];  Service: Obstetrics;  Laterality: N/A;  . MANDIBLE FRACTURE SURGERY Bilateral 2000  . NO PAST SURGERIES      Family History  Problem Relation Age of Onset  . Alcohol abuse Father   . Arthritis Father   . Hyperlipidemia Father   . Hypertension Father   . Diabetes Maternal Aunt   . Diabetes Maternal Grandmother   . Depression Paternal Aunt   . Depression Paternal Uncle   . Kidney disease Mother     Allergies  Allergen Reactions  . Tetracyclines & Related Other (See Comments)    Severe stomach cramps  . Sulfa Antibiotics Rash    After sun exposure    Current Outpatient Medications on File Prior to Visit  Medication Sig Dispense Refill  . ANUCORT-HC 25 MG suppository UNWRAP AND INSERT 1 SUPPOSITORY(25 MG) RECTALLY TWICE DAILY AS NEEDED FOR HEMORRHOIDS OR ITCHING 24 suppository 0  . [facility] FIBER GUMMIES PO Take 2 tablets by mouth daily.    . Magnesium 250 MG TABS Take 250 mg by mouth at bedtime.    . Prenatal Vit-Fe Fumarate-FA (PRENATAL MULTIVITAMIN) TABS tablet Take 1 tablet by mouth daily.    Marland Kitchen. PROCTOFOAM HC rectal foam APPLY RECTALLY TWICE DAILY 10 g 0   No current facility-administered medications on file prior to visit.  BP 125/72 (BP Location: Right Arm, Cuff Size: Normal)   Pulse 88   Temp 97.8 F (36.6 C) (Oral)   Resp 18   Ht 5\' 5"  (1.651 m)   Wt 162 lb 9.6 oz (73.8 kg)   SpO2 100%   BMI 27.06 kg/m       Objective:   Physical Exam  Constitutional: She is oriented to person, place, and time. She appears well-developed and well-nourished. No distress.  Eyes: Right conjunctiva is injected.  Bilateral crusting noted on eyelashes  Cardiovascular: Normal rate and regular rhythm.  No murmur heard. Pulmonary/Chest: Effort normal and breath sounds normal. No respiratory distress. She has no wheezes. She has no rales. She exhibits no tenderness.  Lymphadenopathy:    She has no cervical adenopathy.  Neurological: She  is alert and oriented to person, place, and time.          Assessment & Plan:  Conjuctivitis- rx with erythromycin ointment (category B). Discussed supportive measures for URI symptoms as outlined in AVS. Pt verbalizes understanding.

## 2017-07-12 ENCOUNTER — Telehealth: Payer: Self-pay | Admitting: Family

## 2017-07-12 MED ORDER — HYDROCORTISONE ACETATE 25 MG RE SUPP: RECTAL | 0 refills | Status: DC

## 2017-07-12 NOTE — Telephone Encounter (Signed)
Refill sent. Left detailed message on pt's voicemail regarding Rx completion.

## 2017-07-12 NOTE — Addendum Note (Signed)
Addended by: Mervin KungFERGERSON, Carrina Schoenberger A on: 07/12/2017 10:35 AM   Modules accepted: Orders

## 2017-07-12 NOTE — Telephone Encounter (Signed)
Copied from CRM [phone]#[id number]. Topic: Quick Communication - See Telephone Encounter >> Jul 12, 2017  8:28 AM Windy KalataMichael, Aven Christen L, NT wrote: CRM for notification. See Telephone encounter for: 07/12/17.  Patient is requesting a refill on ANUCORT-HC 25 MG suppository.   [id number] [place], [place] - [address] [place] [phone] Phone: [phone] Fax: [phone]

## 2017-07-12 NOTE — Telephone Encounter (Signed)
Anucort-HC 25 mg suppository refill request  LOV 01/25/16 with Peggyann Juba'Sullivan that deals with this medication.  [facility] [address] [place] [place] - [id number]

## 2017-07-15 ENCOUNTER — Encounter (HOSPITAL_COMMUNITY): Payer: Self-pay | Admitting: *Deleted

## 2017-07-15 ENCOUNTER — Inpatient Hospital Stay (HOSPITAL_COMMUNITY)
Admission: AD | Admit: 2017-07-15 | Discharge: 2017-07-16 | Disposition: A | Discharge status: Home / Self Care | Payer: BC Managed Care – PPO | Source: Ambulatory Visit | Attending: Obstetrics & Gynecology | Admitting: Obstetrics & Gynecology

## 2017-07-15 DIAGNOSIS — O26893 Other specified pregnancy related conditions, third trimester: Secondary | ICD-10-CM | POA: Insufficient documentation

## 2017-07-15 DIAGNOSIS — O4703 False labor before 37 completed weeks of gestation, third trimester: Secondary | ICD-10-CM | POA: Diagnosis not present

## 2017-07-15 DIAGNOSIS — O09213 Supervision of pregnancy with history of pre-term labor, third trimester: Secondary | ICD-10-CM | POA: Diagnosis not present

## 2017-07-15 DIAGNOSIS — O09893 Supervision of other high risk pregnancies, third trimester: Secondary | ICD-10-CM

## 2017-07-15 DIAGNOSIS — Z3A30 30 weeks gestation of pregnancy: Secondary | ICD-10-CM | POA: Diagnosis not present

## 2017-07-15 NOTE — MAU Note (Signed)
Had 3hr GTT Thurs and drank glucola. Fri had loose stools and 4 watery stools today around 1400 but none since. No vomiting. Having some muscle pain all over abdomen but more on R side. Hx PTD with twins at 28wks, one did not survive. Currently has cerclage and takes P17 shots. Denies LOF or bleeding. No vag d/c.Marland Kitchen. Does stay wet but feels that is urine and is not new

## 2017-07-16 DIAGNOSIS — O4703 False labor before 37 completed weeks of gestation, third trimester: Secondary | ICD-10-CM | POA: Diagnosis not present

## 2017-07-16 DIAGNOSIS — O09213 Supervision of pregnancy with history of pre-term labor, third trimester: Secondary | ICD-10-CM

## 2017-07-16 LAB — URINALYSIS, ROUTINE W REFLEX MICROSCOPIC
Bilirubin Urine: NEGATIVE
Glucose, UA: NEGATIVE mg/dL
Hgb urine dipstick: NEGATIVE
KETONES UR: NEGATIVE mg/dL
LEUKOCYTES UA: NEGATIVE
NITRITE: NEGATIVE
PROTEIN: NEGATIVE mg/dL
Specific Gravity, Urine: 1.003 — ABNORMAL LOW (ref 1.005–1.030)
pH: 7 (ref 5.0–8.0)

## 2017-07-16 LAB — WET PREP, GENITAL
CLUE CELLS WET PREP: NONE SEEN
Sperm: NONE SEEN
TRICH WET PREP: NONE SEEN
Yeast Wet Prep HPF POC: NONE SEEN

## 2017-07-16 LAB — FETAL FIBRONECTIN: FETAL FIBRONECTIN: POSITIVE — AB

## 2017-07-16 NOTE — Progress Notes (Signed)
Cervix visually closed on spec exam.

## 2017-07-16 NOTE — MAU Provider Note (Signed)
Chief Complaint:  Abdominal Pain   First Provider Initiated Contact with Patient 07/16/17 0058     HPI: Hannah [name] is a 37 y.o. G2P0101 at [redacted]w[redacted]d who presents to maternity admissions reporting possible contractions and diarrhea. Hx PTD of twins at 38 weeks w/ neonatal demise on one twin due to prematurity. Cerclage in place. Pt extremely anxious.   Location: Low abd Quality: cramping Severity: mild Duration: <24 hours Context: Hx cervical insufficiency w/ previous pregnancy. Prophylactic cerclage in place.  Timing: intermittent Modifying factors: No improvement w/ PO hydration Associated signs and symptoms: Pos for Diarrhea. Neg for fever, chills, VB, LOG, urinary complaints, N/V. HA, vision changes or epigastric pain.  Good fetal movement.   Past Medical History:  Diagnosis Date  . History of preterm delivery, currently pregnant, second trimester 04/14/2017  . Hypertension   . Incompetent cervix in pregnancy, antepartum, second trimester 04/14/2017  . Medical history non-contributory    OB History  Gravida Para Term Preterm AB Living  2 1   1   2   SAB TAB Ectopic Multiple Live Births        1 2    # Outcome Date GA Lbr Len/2nd Weight Sex Delivery Anes PTL Lv  2 Current           1A Preterm 08/31/13 [redacted]w[redacted]d  2 lb 6.1 oz (1.08 kg) F CS-Classical Spinal  LIV  1B Preterm 08/31/13 [redacted]w[redacted]d  1 lb 15.4 oz (0.89 kg) F CS-Classical Spinal  LIV   Past Surgical History:  Procedure Laterality Date  . CERVICAL CERCLAGE N/A 04/14/2017   Procedure: CERCLAGE CERVICAL;  Surgeon: Shea Evans, MD;  Location: [facility];  Service: Gynecology;  Laterality: N/A;  EDD: 09/28/17  . CESAREAN SECTION N/A 08/31/2013   Procedure: CESAREAN SECTION;  Surgeon: Serita Kyle, MD;  Location: [facility];  Service: Obstetrics;  Laterality: N/A;  . MANDIBLE FRACTURE SURGERY Bilateral 2000  . NO PAST SURGERIES     Family History  Problem Relation Age of Onset  . Alcohol abuse Father   . Arthritis Father   .  Hyperlipidemia Father   . Hypertension Father   . Diabetes Maternal Aunt   . Diabetes Maternal Grandmother   . Depression Paternal Aunt   . Depression Paternal Uncle   . Kidney disease Mother    Social History   Tobacco Use  . Smoking status: Never Smoker  . Smokeless tobacco: Never Used  Substance Use Topics  . Alcohol use: Yes    Comment: occassioanl social drinker, not since pregnant  . Drug use: No   Allergies  Allergen Reactions  . Tetracyclines & Related Other (See Comments)    Severe stomach cramps  . Sulfa Antibiotics Rash    After sun exposure   Medications Prior to Admission  Medication Sig Dispense Refill Last Dose  . [facility] FIBER GUMMIES PO Take 2 tablets by mouth daily.   Taking  . hydrocortisone (ANUCORT-HC) 25 MG suppository UNWRAP AND INSERT 1 SUPPOSITORY(25 MG) RECTALLY TWICE DAILY AS NEEDED FOR HEMORRHOIDS OR ITCHING 24 suppository 0   . Magnesium 250 MG TABS Take 250 mg by mouth at bedtime.   Taking  . Prenatal Vit-Fe Fumarate-FA (PRENATAL MULTIVITAMIN) TABS tablet Take 1 tablet by mouth daily.   Taking  . PROCTOFOAM HC rectal foam APPLY RECTALLY TWICE DAILY 10 g 0 Taking    I have reviewed patient's Past Medical Hx, Surgical Hx, Family Hx, Social Hx, medications and allergies.   ROS:  Review of  Systems  Constitutional: Negative for chills and fever.  Eyes: Negative for visual disturbance.  Gastrointestinal: Positive for abdominal pain and diarrhea. Negative for abdominal distention, blood in stool, constipation, nausea and vomiting.  Genitourinary: Negative for difficulty urinating, dysuria, flank pain, frequency, hematuria, urgency, vaginal bleeding and vaginal discharge.  Musculoskeletal: Negative for back pain.  Neurological: Negative for headaches.    Physical Exam   Patient Vitals for the past 24 hrs:  BP Temp Pulse Resp  07/15/17 2334 (!) 133/93 - 96 -  07/15/17 2330 (!) 138/91 98 F (36.7 C) (!) 108 18  Initial BPs taken when pt trembling,  tearful, anxious.   128/81, 123/74  Constitutional: Well-developed, well-nourished female in no physical distress. Extremely anxious, tearful.  Cardiovascular: normal rate Respiratory: normal effort GI: Abd soft, non-tender, gravid appropriate for gestational age.  MS: Extremities nontender, no edema, normal ROM Neurologic: Alert and oriented x 4.  GU: Neg CVAT.  Pelvic: NEFG, physiologic discharge, no blood, cervix visually closed, clean.   Dilation: Closed Effacement (%): Thick Cervical Position: Middle Station: Ballotable Presentation: Undeterminable Exam by:: Ivonne AndrewV. Shamiyah Ngu CNM  Cerclage knot palpated on maternal upper left side of cervix. No tension on cerclage palpable at external os.  FHT:  Baseline 140 , moderate variability, accelerations present, no decelerations Contractions: UI   Labs: Results for orders placed or performed during the hospital encounter of 07/15/17 (from the past 24 hour(s))  Urinalysis, Routine w reflex microscopic     Status: Abnormal   Collection Time: 07/15/17 11:32 PM  Result Value Ref Range   Color, Urine STRAW (A) YELLOW   APPearance CLEAR CLEAR   Specific Gravity, Urine 1.003 (L) 1.005 - 1.030   pH 7.0 5.0 - 8.0   Glucose, UA NEGATIVE NEGATIVE mg/dL   Hgb urine dipstick NEGATIVE NEGATIVE   Bilirubin Urine NEGATIVE NEGATIVE   Ketones, ur NEGATIVE NEGATIVE mg/dL   Protein, ur NEGATIVE NEGATIVE mg/dL   Nitrite NEGATIVE NEGATIVE   Leukocytes, UA NEGATIVE NEGATIVE  Wet prep, genital     Status: Abnormal   Collection Time: 07/16/17 12:50 AM  Result Value Ref Range   Yeast Wet Prep HPF POC NONE SEEN NONE SEEN   Trich, Wet Prep NONE SEEN NONE SEEN   Clue Cells Wet Prep HPF POC NONE SEEN NONE SEEN   WBC, Wet Prep HPF POC MANY (A) NONE SEEN   Sperm NONE SEEN     Imaging:  No results found.  MAU Course: Orders Placed This Encounter  Procedures  . Wet prep, genital  . Urinalysis, Routine w reflex microscopic  . Fetal fibronectin  . Discharge  patient   Discussed Hx, labs, exam w/ Dr. Juliene PinaMody. Informed her that CNM collected fFN, but unsure if there would be false positive form having cerclage in place.  Agrees w/ POC. New orders: Send fFN, but pt may be discharged. Dr. Juliene PinaMody will follow-up on results as needed.   MDM: - Low abd cramping from preterm contractions vs diarrhea. No evidence of active preterm labor, but w/ Hx cervical insufficiency and previous PTD will monitor closely.                      Assessment: 1. Preterm uterine contractions in third trimester, antepartum   2. History of premature delivery, currently pregnant, third trimester     Plan: Discharge home in stable condition per consult w/ Dr. Juliene PinaMody.  Preterm Labor precautions and fetal kick counts. fFN pending. Follow-up Information    Mody,  Susa Day, MD Follow up.   Specialty:  Obstetrics and Gynecology Why:  as scheduled or sooner as needed if symptoms worsen.  Contact information: [address] [place] [address] [phone]        [facility] [place] MATERNITY ADMISSIONS Follow up.   Why:  in pregnancy emergencies Contact information: [address] [id number] mc [place] [address] [phone]          Allergies as of 07/16/2017      Reactions   Tetracyclines & Related Other (See Comments)   Severe stomach cramps   Sulfa Antibiotics Rash   After sun exposure      Medication List    TAKE these medications   [facility] FIBER GUMMIES PO Take 2 tablets by mouth daily.   Magnesium 250 MG Tabs Take 250 mg by mouth at bedtime.   prenatal multivitamin Tabs tablet Take 1 tablet by mouth daily.   PROCTOFOAM HC rectal foam Generic drug:  hydrocortisone-pramoxine APPLY RECTALLY TWICE DAILY     ASK your doctor about these medications   hydrocortisone 25 MG suppository Commonly known as:  ANUCORT-HC UNWRAP AND INSERT 1 SUPPOSITORY(25 MG) RECTALLY TWICE DAILY AS NEEDED FOR HEMORRHOIDS OR [place], [place], CNM 07/16/2017 1:17 AM

## 2017-07-16 NOTE — Discharge Instructions (Signed)

## 2017-07-17 ENCOUNTER — Other Ambulatory Visit: Payer: Self-pay | Admitting: Obstetrics & Gynecology

## 2017-07-20 ENCOUNTER — Encounter (HOSPITAL_COMMUNITY): Payer: Self-pay | Admitting: Advanced Practice Midwife

## 2017-08-23 ENCOUNTER — Other Ambulatory Visit: Payer: Self-pay | Admitting: Obstetrics & Gynecology

## 2017-09-06 ENCOUNTER — Telehealth (HOSPITAL_COMMUNITY): Payer: Self-pay | Admitting: *Deleted

## 2017-09-06 NOTE — Telephone Encounter (Signed)
Preadmission screen  

## 2017-09-07 ENCOUNTER — Encounter (HOSPITAL_COMMUNITY): Payer: Self-pay

## 2017-09-14 ENCOUNTER — Encounter (HOSPITAL_COMMUNITY)
Admission: RE | Admit: 2017-09-14 | Discharge: 2017-09-14 | Disposition: A | Discharge status: Home / Self Care | Payer: BC Managed Care – PPO | Source: Ambulatory Visit | Attending: Obstetrics & Gynecology | Admitting: Obstetrics & Gynecology

## 2017-09-14 LAB — CBC
HCT: 34.1 % — ABNORMAL LOW (ref 36.0–46.0)
Hemoglobin: 11.3 g/dL — ABNORMAL LOW (ref 12.0–15.0)
MCH: 30.5 pg (ref 26.0–34.0)
MCHC: 33.1 g/dL (ref 30.0–36.0)
MCV: 91.9 fL (ref 78.0–100.0)
PLATELETS: 164 10*3/uL (ref 150–400)
RBC: 3.71 MIL/uL — ABNORMAL LOW (ref 3.87–5.11)
RDW: 14 % (ref 11.5–15.5)
WBC: 12.3 10*3/uL — AB (ref 4.0–10.5)

## 2017-09-14 LAB — TYPE AND SCREEN
ABO/RH(D): A POS
Antibody Screen: NEGATIVE

## 2017-09-14 NOTE — Patient Instructions (Signed)
Rudi Heapnna L Mccluskey  09/14/2017   Your procedure is scheduled on:  09/15/2017  [facility] through [facility] at 1100 AM.  Pick up the phone at the desk and dial [phone]  Call this number if you have problems the morning of surgery:[phone]  Remember:   Do not eat food:(After Midnight) Desps de medianoche.  Do not drink clear liquids: (After Midnight) Desps de medianoche.  Take these medicines the morning of surgery with A SIP OF WATER: none   Do not wear jewelry, make-up or nail polish.  Do not wear lotions, powders, or perfumes. Do not wear deodorant.  Do not shave 48 hours prior to surgery.  Do not bring valuables to the hospital.  [facility] is not   responsible for any belongings or valuables brought to the hospital.  Contacts, dentures or bridgework may not be worn into surgery.  Leave suitcase in the car. After surgery it may be brought to your room.  For patients admitted to the hospital, checkout time is 11:00 AM the day of              discharge.    N/A   Please read over the following fact sheets that you were given:   Surgical Site Infection Prevention

## 2017-09-15 ENCOUNTER — Inpatient Hospital Stay (HOSPITAL_COMMUNITY): Payer: BC Managed Care – PPO

## 2017-09-15 ENCOUNTER — Encounter (HOSPITAL_COMMUNITY): Payer: Self-pay | Admitting: *Deleted

## 2017-09-15 ENCOUNTER — Inpatient Hospital Stay (HOSPITAL_COMMUNITY)
Admission: RE | Admit: 2017-09-15 | Discharge: 2017-09-18 | DRG: 784 | Disposition: A | Discharge status: Home / Self Care | Payer: BC Managed Care – PPO | Attending: Obstetrics & Gynecology | Admitting: Obstetrics & Gynecology

## 2017-09-15 ENCOUNTER — Encounter (HOSPITAL_COMMUNITY)
Admission: RE | Disposition: A | Discharge status: Home / Self Care | Payer: Self-pay | Source: Home / Self Care | Attending: Obstetrics & Gynecology

## 2017-09-15 DIAGNOSIS — O99892 Other specified diseases and conditions complicating childbirth: Secondary | ICD-10-CM | POA: Diagnosis present

## 2017-09-15 DIAGNOSIS — O9081 Anemia of the puerperium: Secondary | ICD-10-CM | POA: Diagnosis not present

## 2017-09-15 DIAGNOSIS — Z302 Encounter for sterilization: Secondary | ICD-10-CM

## 2017-09-15 DIAGNOSIS — O34219 Maternal care for unspecified type scar from previous cesarean delivery: Secondary | ICD-10-CM

## 2017-09-15 DIAGNOSIS — O3432 Maternal care for cervical incompetence, second trimester: Secondary | ICD-10-CM | POA: Diagnosis present

## 2017-09-15 DIAGNOSIS — O34211 Maternal care for low transverse scar from previous cesarean delivery: Principal | ICD-10-CM | POA: Diagnosis present

## 2017-09-15 DIAGNOSIS — D62 Acute posthemorrhagic anemia: Secondary | ICD-10-CM | POA: Diagnosis not present

## 2017-09-15 DIAGNOSIS — Z3A38 38 weeks gestation of pregnancy: Secondary | ICD-10-CM

## 2017-09-15 DIAGNOSIS — O09219 Supervision of pregnancy with history of pre-term labor, unspecified trimester: Secondary | ICD-10-CM

## 2017-09-15 DIAGNOSIS — O9902 Anemia complicating childbirth: Secondary | ICD-10-CM | POA: Diagnosis present

## 2017-09-15 HISTORY — DX: Supervision of pregnancy with history of pre-term labor, unspecified trimester: O09.219

## 2017-09-15 LAB — RPR: RPR Ser Ql: NONREACTIVE

## 2017-09-15 SURGERY — Surgical Case
Anesthesia: Spinal | Laterality: Bilateral

## 2017-09-15 MED ORDER — MAGNESIUM 200 MG PO TABS
200.0000 mg | ORAL_TABLET | Freq: Every day | ORAL | Status: DC
  Administered 2017-09-16 – 2017-09-17 (×2): 200 mg via ORAL
  Filled 2017-09-15 (×3): qty 1

## 2017-09-15 MED ORDER — PHENYLEPHRINE 8 MG IN D5W 100 ML (0.08MG/ML) PREMIX OPTIME
INJECTION | INTRAVENOUS | Status: DC | PRN
  Administered 2017-09-15: 30 ug/min via INTRAVENOUS

## 2017-09-15 MED ORDER — ACETAMINOPHEN 500 MG PO TABS
1000.0000 mg | ORAL_TABLET | Freq: Four times a day (QID) | ORAL | Status: AC
  Administered 2017-09-15 – 2017-09-16 (×4): 1000 mg via ORAL
  Filled 2017-09-15 (×4): qty 2

## 2017-09-15 MED ORDER — SENNOSIDES-DOCUSATE SODIUM 8.6-50 MG PO TABS
2.0000 | ORAL_TABLET | ORAL | Status: DC
  Administered 2017-09-15 – 2017-09-18 (×3): 2 via ORAL
  Filled 2017-09-15 (×3): qty 2

## 2017-09-15 MED ORDER — SCOPOLAMINE 1 MG/3DAYS TD PT72: 1.0000 | MEDICATED_PATCH | Freq: Once | TRANSDERMAL | Status: DC

## 2017-09-15 MED ORDER — OXYTOCIN 10 UNIT/ML IJ SOLN
INTRAMUSCULAR | Status: DC | PRN
  Administered 2017-09-15: 40 [IU] via INTRAVENOUS

## 2017-09-15 MED ORDER — MEPERIDINE HCL 25 MG/ML IJ SOLN: 6.2500 mg | INTRAMUSCULAR | Status: DC | PRN

## 2017-09-15 MED ORDER — PHENYLEPHRINE HCL 10 MG/ML IJ SOLN
INTRAMUSCULAR | Status: DC | PRN
  Administered 2017-09-15 (×2): 80 ug via INTRAVENOUS

## 2017-09-15 MED ORDER — FENTANYL CITRATE (PF) 100 MCG/2ML IJ SOLN
INTRAMUSCULAR | Status: DC | PRN
  Administered 2017-09-15: 20 ug via INTRATHECAL

## 2017-09-15 MED ORDER — OXYCODONE HCL 5 MG PO TABS: 10.0000 mg | ORAL_TABLET | ORAL | Status: DC | PRN

## 2017-09-15 MED ORDER — FENTANYL CITRATE (PF) 100 MCG/2ML IJ SOLN
INTRAMUSCULAR | Status: AC
  Filled 2017-09-15: qty 2

## 2017-09-15 MED ORDER — CEFAZOLIN SODIUM-DEXTROSE 2-4 GM/100ML-% IV SOLN
2.0000 g | INTRAVENOUS | Status: AC
  Administered 2017-09-15: 2 g via INTRAVENOUS

## 2017-09-15 MED ORDER — LACTATED RINGERS IV SOLN
INTRAVENOUS | Status: DC
  Administered 2017-09-15 (×2): via INTRAVENOUS

## 2017-09-15 MED ORDER — SIMETHICONE 80 MG PO CHEW
80.0000 mg | CHEWABLE_TABLET | Freq: Three times a day (TID) | ORAL | Status: DC
  Administered 2017-09-15 – 2017-09-18 (×8): 80 mg via ORAL
  Filled 2017-09-15 (×8): qty 1

## 2017-09-15 MED ORDER — MORPHINE SULFATE (PF) 0.5 MG/ML IJ SOLN
INTRAMUSCULAR | Status: AC
Start: 2017-09-15 — End: ?
  Filled 2017-09-15: qty 10

## 2017-09-15 MED ORDER — COCONUT OIL OIL
1.0000 | TOPICAL_OIL | Status: DC | PRN
  Administered 2017-09-16: 1 via TOPICAL
  Filled 2017-09-15: qty 120

## 2017-09-15 MED ORDER — SODIUM CHLORIDE 0.9 % IR SOLN
Status: DC | PRN
  Administered 2017-09-15: 1000 mL

## 2017-09-15 MED ORDER — LACTATED RINGERS IV SOLN
INTRAVENOUS | Status: DC
  Administered 2017-09-16: 02:00:00 via INTRAVENOUS

## 2017-09-15 MED ORDER — BUPIVACAINE IN DEXTROSE 0.75-8.25 % IT SOLN
INTRATHECAL | Status: DC | PRN
  Administered 2017-09-15: 14 mL via INTRATHECAL

## 2017-09-15 MED ORDER — NALBUPHINE HCL 10 MG/ML IJ SOLN: 5.0000 mg | INTRAMUSCULAR | Status: DC | PRN

## 2017-09-15 MED ORDER — NALBUPHINE HCL 10 MG/ML IJ SOLN: 5.0000 mg | Freq: Once | INTRAMUSCULAR | Status: DC | PRN

## 2017-09-15 MED ORDER — NALBUPHINE HCL 10 MG/ML IJ SOLN
5.0000 mg | INTRAMUSCULAR | Status: DC | PRN
  Administered 2017-09-15: 5 mg via INTRAVENOUS
  Filled 2017-09-15: qty 1

## 2017-09-15 MED ORDER — NALBUPHINE HCL 10 MG/ML IJ SOLN
5.0000 mg | Freq: Once | INTRAMUSCULAR | Status: DC | PRN
Start: 2017-09-15 — End: 2017-09-18

## 2017-09-15 MED ORDER — KETOROLAC TROMETHAMINE 30 MG/ML IJ SOLN: 30.0000 mg | Freq: Once | INTRAMUSCULAR | Status: DC | PRN

## 2017-09-15 MED ORDER — MORPHINE SULFATE (PF) 0.5 MG/ML IJ SOLN
INTRAMUSCULAR | Status: DC | PRN
  Administered 2017-09-15: .2 mg via INTRATHECAL

## 2017-09-15 MED ORDER — KETOROLAC TROMETHAMINE 30 MG/ML IJ SOLN: 30.0000 mg | Freq: Four times a day (QID) | INTRAMUSCULAR | Status: AC | PRN

## 2017-09-15 MED ORDER — DIPHENHYDRAMINE HCL 50 MG/ML IJ SOLN
12.5000 mg | INTRAMUSCULAR | Status: DC | PRN
Start: 2017-09-15 — End: 2017-09-18

## 2017-09-15 MED ORDER — SIMETHICONE 80 MG PO CHEW
80.0000 mg | CHEWABLE_TABLET | ORAL | Status: DC
  Administered 2017-09-15 – 2017-09-18 (×3): 80 mg via ORAL
  Filled 2017-09-15 (×3): qty 1

## 2017-09-15 MED ORDER — ONDANSETRON HCL 4 MG/2ML IJ SOLN
4.0000 mg | Freq: Three times a day (TID) | INTRAMUSCULAR | Status: DC | PRN
Start: 2017-09-15 — End: 2017-09-18

## 2017-09-15 MED ORDER — DEXAMETHASONE SODIUM PHOSPHATE 4 MG/ML IJ SOLN
INTRAMUSCULAR | Status: AC
Start: 2017-09-15 — End: ?
  Filled 2017-09-15: qty 1

## 2017-09-15 MED ORDER — PRENATAL MULTIVITAMIN CH
1.0000 | ORAL_TABLET | Freq: Every day | ORAL | Status: DC
  Administered 2017-09-16 – 2017-09-18 (×3): 1 via ORAL
  Filled 2017-09-15 (×3): qty 1

## 2017-09-15 MED ORDER — MIDAZOLAM HCL 2 MG/2ML IJ SOLN
INTRAMUSCULAR | Status: AC
  Filled 2017-09-15: qty 2

## 2017-09-15 MED ORDER — SODIUM CHLORIDE 0.9% FLUSH: 3.0000 mL | INTRAVENOUS | Status: DC | PRN

## 2017-09-15 MED ORDER — PROMETHAZINE HCL 25 MG/ML IJ SOLN: 6.2500 mg | INTRAMUSCULAR | Status: DC | PRN

## 2017-09-15 MED ORDER — DOCUSATE SODIUM 100 MG PO CAPS
100.0000 mg | ORAL_CAPSULE | Freq: Every day | ORAL | Status: DC | PRN
Start: 2017-09-15 — End: 2017-09-18

## 2017-09-15 MED ORDER — FERROUS SULFATE 325 (65 FE) MG PO TABS
325.0000 mg | ORAL_TABLET | ORAL | Status: DC
  Administered 2017-09-16 – 2017-09-18 (×2): 325 mg via ORAL
  Filled 2017-09-15 (×2): qty 1

## 2017-09-15 MED ORDER — STERILE WATER FOR IRRIGATION IR SOLN
Status: DC | PRN
Start: 2017-09-15 — End: 2017-09-15
  Administered 2017-09-15: 1000 mL

## 2017-09-15 MED ORDER — PHENYLEPHRINE 8 MG IN D5W 100 ML (0.08MG/ML) PREMIX OPTIME
INJECTION | INTRAVENOUS | Status: AC
  Filled 2017-09-15: qty 100

## 2017-09-15 MED ORDER — DIPHENHYDRAMINE HCL 25 MG PO CAPS
25.0000 mg | ORAL_CAPSULE | ORAL | Status: DC | PRN
  Filled 2017-09-15: qty 1

## 2017-09-15 MED ORDER — WITCH HAZEL-GLYCERIN EX PADS: 1.0000 "application " | MEDICATED_PAD | CUTANEOUS | Status: DC | PRN

## 2017-09-15 MED ORDER — NALOXONE HCL 0.4 MG/ML IJ SOLN
0.4000 mg | INTRAMUSCULAR | Status: DC | PRN
Start: 2017-09-15 — End: 2017-09-18

## 2017-09-15 MED ORDER — ZOLPIDEM TARTRATE 5 MG PO TABS: 5.0000 mg | ORAL_TABLET | Freq: Every evening | ORAL | Status: DC | PRN

## 2017-09-15 MED ORDER — SIMETHICONE 80 MG PO CHEW: 80.0000 mg | CHEWABLE_TABLET | ORAL | Status: DC | PRN

## 2017-09-15 MED ORDER — MENTHOL 3 MG MT LOZG: 1.0000 | LOZENGE | OROMUCOSAL | Status: DC | PRN

## 2017-09-15 MED ORDER — NALOXONE HCL 4 MG/10ML IJ SOLN
1.0000 ug/kg/h | INTRAVENOUS | Status: DC | PRN
  Filled 2017-09-15: qty 5

## 2017-09-15 MED ORDER — PRENATAL MULTIVITAMIN CH: 1.0000 | ORAL_TABLET | Freq: Every day | ORAL | Status: DC

## 2017-09-15 MED ORDER — DIPHENHYDRAMINE HCL 25 MG PO CAPS: 25.0000 mg | ORAL_CAPSULE | Freq: Four times a day (QID) | ORAL | Status: DC | PRN

## 2017-09-15 MED ORDER — HYDROMORPHONE HCL 1 MG/ML IJ SOLN: 0.2500 mg | INTRAMUSCULAR | Status: DC | PRN

## 2017-09-15 MED ORDER — IBUPROFEN 600 MG PO TABS
600.0000 mg | ORAL_TABLET | Freq: Four times a day (QID) | ORAL | Status: DC
  Administered 2017-09-15 – 2017-09-18 (×12): 600 mg via ORAL
  Filled 2017-09-15 (×12): qty 1

## 2017-09-15 MED ORDER — OXYTOCIN 40 UNITS IN LACTATED RINGERS INFUSION - SIMPLE MED: 2.5000 [IU]/h | INTRAVENOUS | Status: AC

## 2017-09-15 MED ORDER — ONDANSETRON HCL 4 MG/2ML IJ SOLN
INTRAMUSCULAR | Status: AC
  Filled 2017-09-15: qty 2

## 2017-09-15 MED ORDER — OXYCODONE HCL 5 MG PO TABS
5.0000 mg | ORAL_TABLET | ORAL | Status: DC | PRN
  Administered 2017-09-16 – 2017-09-18 (×6): 5 mg via ORAL
  Filled 2017-09-15 (×7): qty 1

## 2017-09-15 MED ORDER — DEXAMETHASONE SODIUM PHOSPHATE 4 MG/ML IJ SOLN
INTRAMUSCULAR | Status: DC | PRN
  Administered 2017-09-15: 4 mg via INTRAVENOUS

## 2017-09-15 MED ORDER — TETANUS-DIPHTH-ACELL PERTUSSIS 5-2.5-18.5 LF-MCG/0.5 IM SUSP: 0.5000 mL | Freq: Once | INTRAMUSCULAR | Status: DC

## 2017-09-15 MED ORDER — SCOPOLAMINE 1 MG/3DAYS TD PT72
MEDICATED_PATCH | TRANSDERMAL | Status: DC | PRN
  Administered 2017-09-15: 1 via TRANSDERMAL

## 2017-09-15 MED ORDER — OXYTOCIN 10 UNIT/ML IJ SOLN
INTRAMUSCULAR | Status: AC
  Filled 2017-09-15: qty 4

## 2017-09-15 MED ORDER — ONDANSETRON HCL 4 MG/2ML IJ SOLN
INTRAMUSCULAR | Status: DC | PRN
  Administered 2017-09-15: 4 mg via INTRAVENOUS

## 2017-09-15 MED ORDER — ACETAMINOPHEN 325 MG PO TABS
650.0000 mg | ORAL_TABLET | ORAL | Status: DC | PRN
  Administered 2017-09-17 – 2017-09-18 (×5): 650 mg via ORAL
  Filled 2017-09-15 (×6): qty 2

## 2017-09-15 MED ORDER — DIBUCAINE 1 % RE OINT: 1.0000 "application " | TOPICAL_OINTMENT | RECTAL | Status: DC | PRN

## 2017-09-15 SURGICAL SUPPLY — 33 items
BENZOIN TINCTURE PRP APPL 2/3 (GAUZE/BANDAGES/DRESSINGS) ×3 IMPLANT
CHLORAPREP W/TINT 26ML (MISCELLANEOUS) ×3 IMPLANT
CLAMP CORD UMBIL (MISCELLANEOUS) IMPLANT
CLOSURE WOUND 1/2 X4 (GAUZE/BANDAGES/DRESSINGS) ×1
CLOTH BEACON ORANGE TIMEOUT ST (SAFETY) ×3 IMPLANT
DRSG OPSITE POSTOP 4X10 (GAUZE/BANDAGES/DRESSINGS) ×3 IMPLANT
ELECT REM PT RETURN 9FT ADLT (ELECTROSURGICAL) ×3
ELECTRODE REM PT RTRN 9FT ADLT (ELECTROSURGICAL) ×1 IMPLANT
EXTRACTOR VACUUM KIWI (MISCELLANEOUS) IMPLANT
EXTRACTOR VACUUM M CUP 4 TUBE (SUCTIONS) IMPLANT
EXTRACTOR VACUUM M CUP 4' TUBE (SUCTIONS)
GLOVE BIO SURGEON STRL SZ7 (GLOVE) ×3 IMPLANT
GLOVE BIOGEL PI IND STRL 7.0 (GLOVE) ×3 IMPLANT
GLOVE BIOGEL PI INDICATOR 7.0 (GLOVE) ×6
GOWN STRL REUS W/TWL LRG LVL3 (GOWN DISPOSABLE) ×9 IMPLANT
KIT ABG SYR 3ML LUER SLIP (SYRINGE) IMPLANT
NEEDLE HYPO 25X5/8 SAFETYGLIDE (NEEDLE) IMPLANT
NS IRRIG 1000ML POUR BTL (IV SOLUTION) ×3 IMPLANT
PACK C SECTION WH (CUSTOM PROCEDURE TRAY) ×3 IMPLANT
PAD OB MATERNITY 4.3X12.25 (PERSONAL CARE ITEMS) ×3 IMPLANT
RTRCTR C-SECT PINK 25CM LRG (MISCELLANEOUS) IMPLANT
STRIP CLOSURE SKIN 1/2X4 (GAUZE/BANDAGES/DRESSINGS) ×2 IMPLANT
SUT MON AB-0 CT1 36 (SUTURE) ×6 IMPLANT
SUT PLAIN 0 NONE (SUTURE) IMPLANT
SUT PLAIN 2 0 (SUTURE)
SUT PLAIN ABS 2-0 CT1 27XMFL (SUTURE) IMPLANT
SUT VIC AB 0 CT1 27 (SUTURE) ×4
SUT VIC AB 0 CT1 27XBRD ANBCTR (SUTURE) ×2 IMPLANT
SUT VIC AB 2-0 CT1 27 (SUTURE) ×2
SUT VIC AB 2-0 CT1 TAPERPNT 27 (SUTURE) ×1 IMPLANT
SUT VIC AB 4-0 KS 27 (SUTURE) ×3 IMPLANT
TOWEL OR 17X24 6PK STRL BLUE (TOWEL DISPOSABLE) ×3 IMPLANT
TRAY FOLEY W/BAG SLVR 14FR LF (SET/KITS/TRAYS/PACK) IMPLANT

## 2017-09-15 NOTE — Transfer of Care (Signed)
Immediate Anesthesia Transfer of Care Note  Patient: Hannah [name]  Procedure(s) Performed: Repeat CESAREAN SECTION WITH BILATERAL TUBAL LIGATION (Bilateral )  Patient Location: PACU  Anesthesia Type:Spinal  Level of Consciousness: awake, alert  and oriented  Airway & Oxygen Therapy: Patient Spontanous Breathing  Post-op Assessment: Report given to RN and Post -op Vital signs reviewed and stable  Post vital signs: Reviewed and stable HR 125/84, HR 68, RR 12, SaO2 95%  Last Vitals:  Vitals Value Taken Time  BP 125/84 09/15/2017  2:26 PM  Temp    Pulse 72 09/15/2017  2:29 PM  Resp 11 09/15/2017  2:29 PM  SpO2 94 % 09/15/2017  2:29 PM  Vitals shown include unvalidated device data.  Last Pain:  Vitals:   09/15/17 1123  TempSrc: Oral  PainSc: 0-No pain      Patients Stated Pain Goal: 4 (09/15/17 1123)  Complications: No apparent anesthesia complications

## 2017-09-15 NOTE — Anesthesia Postprocedure Evaluation (Signed)
Anesthesia Post Note  Patient: Rudi Heapnna L [place]  Procedure(s) Performed: Repeat CESAREAN SECTION WITH BILATERAL TUBAL LIGATION (Bilateral )     Patient location during evaluation: PACU Anesthesia Type: Spinal Level of consciousness: awake Pain management: pain level controlled Vital Signs Assessment: post-procedure vital signs reviewed and stable Respiratory status: spontaneous breathing Cardiovascular status: stable Postop Assessment: spinal receding, patient able to bend at knees, no apparent nausea or vomiting and no headache Anesthetic complications: no    Last Vitals:  Vitals:   09/15/17 1607 09/15/17 1705  BP:  121/83  Pulse:  (!) 55  Resp:    Temp: (!) 36.3 C 36.6 C  SpO2: 98% 98%    Last Pain:  Vitals:   09/15/17 1705  TempSrc: Oral  PainSc: 2    Pain Goal: Patients Stated Pain Goal: 4 (09/15/17 1123)               Lalah Durango JR,JOHN Susann GivensFRANKLIN

## 2017-09-15 NOTE — H&P (Signed)
Hannah [name] is a 37 y.o. female presenting for repeat C-section and tubal ligation, at 38 wks due to prior low vertical c/section at 28 wks that involved lower fundal area. In that pregnancy she had twins and one girl died soon after birth.  Pt desires permanent sterilization as long as this girl at healthy at birth  Preg complicated by prior preterm labor and incompetent cervix, cerclage in the pregnancy at 16 wks (per pt's choice) and Makena progesterone injections from 16 to 36 wks. Cercalge was removed at 37 wks in office AMA- normal NIPS Anxiety due to past preg events   OB History    Gravida  2   Para  1   Term      Preterm  1   AB      Living  1     SAB      TAB      Ectopic      Multiple  1   Live Births  2          Past Medical History:  Diagnosis Date  . History of preterm delivery, currently pregnant, second trimester 04/14/2017  . Hypertension   . Incompetent cervix in pregnancy, antepartum, second trimester 04/14/2017  . Medical history non-contributory    Past Surgical History:  Procedure Laterality Date  . CERVICAL CERCLAGE N/A 04/14/2017   Procedure: CERCLAGE CERVICAL;  Surgeon: Shea EvansMody, Zayvien Canning, MD;  Location: [facility];  Service: Gynecology;  Laterality: N/A;  EDD: 09/28/17  . CESAREAN SECTION N/A 08/31/2013   Procedure: CESAREAN SECTION;  Surgeon: Serita KyleSheronette A Cousins, MD;  Location: [facility];  Service: Obstetrics;  Laterality: N/A;  . MANDIBLE FRACTURE SURGERY Bilateral 2000  . NO PAST SURGERIES     Family History: family history includes Alcohol abuse in her father; Arthritis in her father; Depression in her paternal aunt and paternal uncle; Diabetes in her maternal aunt and maternal grandmother; Hyperlipidemia in her father; Hypertension in her father; Kidney disease in her mother. Social History:  reports that she has never smoked. She has never used smokeless tobacco. She reports that she drinks alcohol. She reports that she does not use drugs.      Maternal Diabetes: No Genetic Screening: Normal Maternal Ultrasounds/Referrals: Normal Fetal Ultrasounds or other Referrals:  None Maternal Substance Abuse:  No Significant Maternal Medications:  Meds include: Progesterone Significant Maternal Lab Results:  Lab values include: Group B Strep negative Other Comments:  None  ROS History   Blood pressure 127/77, pulse 73, temperature 98.2 F (36.8 C), temperature source Oral, resp. rate 18, height 5\' 5"  (1.651 m), weight 181 lb (82.1 kg), last menstrual period 10/20/2016, unknown if currently breastfeeding. Exam Physical Exam  Physical exam:  A&O x 3, no acute distress. Pleasant HEENT neg, no thyromegaly Lungs CTA bilat CV RRR, S1S2 normal Abdo soft, non tender, non acute Extr no edema/ tenderness Pelvic def FHT 130s Toco none  Prenatal labs: ABO, Rh: --/--/A POS (06/06 1015) Antibody: NEG (06/06 1015) Rubella: Immune (11/21 0000) RPR: Non Reactive (06/06 1015)  HBsAg: Negative (11/21 0000)  HIV: Non-reactive (11/21 0000)  GBS:   neg GLucola normal   Assessment/Plan: 37 yo G2P0101 (only one twin survived) now 38 wks for early c-section due to prior low vertical c-section but involved lower part of uterine body as well being 28 wk then RC/s and tulabl ligation also desired. Risk of regret, irreversibility reviewed, patient understands Risks/complications of surgery reviewed incl infection, bleeding, damage to internal organs including  bladder, bowels, ureters, blood vessels, other risks from anesthesia, VTE and delayed complications of any surgery, complications in future surgery reviewed. Also discussed neonatal complications incl difficult delivery, laceration, vacuum assistance, TTN etc. Pt understands and agrees, all concerns addressed.      Robley Fries 09/15/2017, 1:01 PM

## 2017-09-15 NOTE — Op Note (Addendum)
Procedure Note Hannah [name]  09/15/2017  Procedure: Repeat Low Hannah Heaptransverse cesarean section and bilateral tubal sterilization by salpingectomy  Indications: 38 weeks, prior low vertical c-section. Undesired fertility   Pre-operative Diagnosis: Previous Low Vertical Cesarean Section, Desires Sterilization.   Post-operative Diagnosis: Same   Surgeon: Shea EvansMody, Dabid Godown, MD    Assistants: Genice Rougeracey Tucker, RNFA  Anesthesia: spinal   Procedure Details:  The patient was seen in the Holding Room. The risks, benefits, complications, treatment options, and expected outcomes were discussed with the patient. The patient concurred with the proposed plan, giving informed consent. identified as Hannah [name] and the procedure verified as C-Section Delivery. A Time Out was held and the above information confirmed.  After induction of anesthesia, the patient was draped and prepped in the usual sterile manner and foley was placed. A Pfannensteil Incision was made and carried down through the subcutaneous tissue to the fascia. Fascial incision was made and extended transversely. The fascia was separated from the underlying rectus tissue superiorly and inferiorly. The peritoneum was identified and entered. Peritoneal incision was extended longitudinally. Alexis retractor placed. The utero-vesical peritoneal reflection was incised transversely and the bladder flap was bluntly freed from the lower uterine segment. A low transverse uterine incision was made. Lower segment was very thin but no scar dehiscence noted. Amniotomy done. Fluid was clear. Delivered from cephalic presentation was a vigorous female infant at 13.40 hours, loose nuchal cord released over the head after head delivery. Delayed cord clamping done at 1 minute and baby handed to NICU team in attendance. Apgar scores of 8 at one minute and 9 at five minutes. Cord ph was not sent. Cord blood was obtained for evaluation. The placenta was removed Intact and  appeared normal. The uterine outline, tubes and ovaries appeared normal}. The uterine incision was closed with running locked sutures of 0Monocryl followed by a second imbricating layer. Hemostasis was observed.  Bilateral tubes assessed, no adhesions noted. Left tube grasped with Tanja PortBabcock, a window created in the mesosalpinx and two 0-plain gut ties placed and salpingectomy performed. Similarly right salpingectomy performed. Hemostasis noted.   Alexis retractor removed. Peritoneal closure with 2-0 Vicryl. The fascia was then reapproximated with running sutures of 0Vicryl. The subcuticular closure was performed using 2-0plain gut. The skin was closed with 4-0Vicryl. Sterile dressings placed.   Instrument, sponge, and needle counts were correct prior the abdominal closure and were correct at the conclusion of the case.   Findings:Thin lower segment noted. Baby girl delivered cephalic via low transverse hysterotomy and delayed cord clamping done at 1 minute. Apgars 8 and 9. Normal tubes, ovaries    Estimated Blood Loss: 396 mL   Total IV Fluids: 1500 ml LR   Urine Output: 100CC OF clear urine  Specimens: cord blood, both fallopian tubes    Complications: no complications  Disposition: PACU - hemodynamically stable.   Maternal Condition: stable   Baby condition / location:  Couplet care / Skin to Skin  Attending Attestation: I performed the procedure.   Signed: Surgeon(s): Shea EvansMody, Hannah Fazzino, MD

## 2017-09-15 NOTE — Anesthesia Preprocedure Evaluation (Signed)
Anesthesia Evaluation  Patient identified by MRN, date of birth, ID band Patient awake    Reviewed: Allergy & Precautions, H&P , NPO status , Patient's Chart, lab work & pertinent test results  Airway Mallampati: I  TM Distance: >3 FB Neck ROM: Full    Dental no notable dental hx. (+) Teeth Intact   Pulmonary neg pulmonary ROS,    Pulmonary exam normal breath sounds clear to auscultation       Cardiovascular hypertension, Pt. on medications Normal cardiovascular exam Rhythm:Regular Rate:Normal     Neuro/Psych negative neurological ROS  negative psych ROS   GI/Hepatic negative GI ROS, Neg liver ROS,   Endo/Other  negative endocrine ROS  Renal/GU negative Renal ROS  negative genitourinary   Musculoskeletal negative musculoskeletal ROS (+)   Abdominal Normal abdominal exam  (+)   Peds  Hematology  (+) Blood dyscrasia, anemia ,   Anesthesia Other Findings   Reproductive/Obstetrics (+) Pregnancy Twin gestation 5627 weeks SROM                             Anesthesia Physical  Anesthesia Plan  ASA: II and emergent  Anesthesia Plan: Spinal   Post-op Pain Management:    Induction:   PONV Risk Score and Plan: 3 and Ondansetron, Dexamethasone and Scopolamine patch - Pre-op  Airway Management Planned: Natural Airway and Nasal Cannula  Additional Equipment:   Intra-op Plan:   Post-operative Plan:   Informed Consent: I have reviewed the patients History and Physical, chart, labs and discussed the procedure including the risks, benefits and alternatives for the proposed anesthesia with the patient or authorized representative who has indicated his/her understanding and acceptance.     Plan Discussed with: CRNA and Surgeon  Anesthesia Plan Comments:         Anesthesia Quick Evaluation

## 2017-09-15 NOTE — Anesthesia Procedure Notes (Signed)
Spinal  Patient location during procedure: OR Start time: 09/15/2017 1:11 PM End time: 09/15/2017 1:15 PM Staffing Anesthesiologist: Leilani AbleHatchett, Batu Cassin, MD Performed: anesthesiologist  Preanesthetic Checklist Completed: patient identified, site marked, surgical consent, pre-op evaluation, timeout performed, IV checked, risks and benefits discussed and monitors and equipment checked Spinal Block Patient position: sitting Prep: site prepped and draped and DuraPrep Patient monitoring: continuous pulse ox and blood pressure Approach: midline Location: L3-4 Injection technique: single-shot Needle Needle type: Pencan  Needle gauge: 24 G Needle length: 10 cm Needle insertion depth: 5 cm Assessment Sensory level: T4

## 2017-09-15 NOTE — Progress Notes (Signed)
I offered support to Hannah [name] and [name] while they waited for [name] C/S to occur.  I knew them from their previous pregnancy when they lost one of their twins after a preterm delivery and a time in the NICU.  They reached out and asked for us to check in on them today.  They are in good spirits and are so grateful to be able to have such a different experience this time.  Chaplain Dyanne CarrelKaty Marwan Lipe, Bcc Pager, [phone] 2:25 PM    09/15/17 1400  Clinical Encounter Type  Visited With Patient and family together

## 2017-09-16 ENCOUNTER — Encounter (HOSPITAL_COMMUNITY): Payer: Self-pay

## 2017-09-16 DIAGNOSIS — O9902 Anemia complicating childbirth: Secondary | ICD-10-CM | POA: Diagnosis present

## 2017-09-16 LAB — CBC
HEMATOCRIT: 26.5 % — AB (ref 36.0–46.0)
HEMOGLOBIN: 9.1 g/dL — AB (ref 12.0–15.0)
MCH: 30.6 pg (ref 26.0–34.0)
MCHC: 34.3 g/dL (ref 30.0–36.0)
MCV: 89.2 fL (ref 78.0–100.0)
Platelets: 124 10*3/uL — ABNORMAL LOW (ref 150–400)
RBC: 2.97 MIL/uL — AB (ref 3.87–5.11)
RDW: 13.5 % (ref 11.5–15.5)
WBC: 16.1 10*3/uL — ABNORMAL HIGH (ref 4.0–10.5)

## 2017-09-16 LAB — BIRTH TISSUE RECOVERY COLLECTION (PLACENTA DONATION)

## 2017-09-16 NOTE — Lactation Note (Signed)
This note was copied from a baby's chart. Lactation Consultation Note  Patient Name: Hannah [name] Reason for consult: Follow-up assessment;Difficult latch  As I was charting at the front desk I heard baby screaming in the room for a few minutes.  I went in to see if I could assist mother and she stated that baby fed approximately one hour ago but she continued crying.  I offered to assist with latch to see if baby wanted to feed again and mother accepted.  Mother wanted to try the cross cradle hold on the left breast.  Infant was able to latch well but continued to be fussy and not suck at the breast.  I offered to try to burp baby and patted her until she burped loudly.  Mother questioning whether or not baby has been having gas today.  After burping I attempted to latch again with success.  Baby was at the breast but showed little interest in feeding at this time.  She would occasionally suck a few times and then just look around.  Mother wanted to rest so I swaddled baby and told her to call for assistance as needed.  Infant quietly resting now in bassinet and RN updated.   Maternal Data Formula Feeding for Exclusion: No Has patient been taught Hand Expression?: Yes Does the patient have breastfeeding experience prior to this delivery?: Yes  Feeding Feeding Type: Breast Fed Length of feed: 10 min  LATCH Score Latch: Repeated attempts needed to sustain latch, nipple held in mouth throughout feeding, stimulation needed to elicit sucking reflex.  Audible Swallowing: A few with stimulation  Type of Nipple: Everted at rest and after stimulation  Comfort (Breast/Nipple): Soft / non-tender  Hold (Positioning): Assistance needed to correctly position infant at breast and maintain latch.  LATCH Score: 7  Interventions Interventions: Breast feeding basics reviewed;Assisted with latch;Skin to skin;Breast massage;Position options;Support pillows;Breast  compression;Comfort gels  Lactation Tools Discussed/Used     Consult Status Consult Status: Follow-up Date: 09/17/17 Follow-up type: In-patient    Hannah [name] [name], 10:28 PM

## 2017-09-16 NOTE — Anesthesia Postprocedure Evaluation (Signed)
Anesthesia Post Note  Patient: Hannah [name]  Procedure(s) Performed: Repeat CESAREAN SECTION WITH BILATERAL TUBAL LIGATION (Bilateral )     Patient location during evaluation: Mother Baby Anesthesia Type: Spinal Level of consciousness: awake and alert and oriented Pain management: satisfactory to patient Vital Signs Assessment: post-procedure vital signs reviewed and stable Respiratory status: spontaneous breathing and nonlabored ventilation Cardiovascular status: stable Postop Assessment: no headache, no backache, patient able to bend at knees, no signs of nausea or vomiting, adequate PO intake and able to ambulate Anesthetic complications: no    Last Vitals:  Vitals:   09/16/17 0140 09/16/17 0604  BP:  96/65  Pulse:  (!) 56  Resp:  18  Temp:  36.7 C  SpO2: 96% 96%    Last Pain:  Vitals:   09/16/17 0604  TempSrc: Oral  PainSc: 0-No pain   Pain Goal: Patients Stated Pain Goal: 4 (09/15/17 1123)               Madison HickmanGREGORY,Mackie Holness

## 2017-09-16 NOTE — Progress Notes (Signed)
Subjective: POD# 1 Information for the patient's newborn:  Charlesetta IvoryLepkowski, Girl Arienne [[id number]][[id number]]  female  Baby name: [name]  Reports feeling well Feeding: breast Patient reports tolerating PO.  Breast symptoms: none Pain controlled with PO meds Denies HA/SOB/C/P/N/V/dizziness. Flatus present. She reports vaginal bleeding as normal, without clots.  She is ambulating, urinating without difficulty.     Objective:   VS:    Vitals:   09/15/17 2339 09/16/17 0140 09/16/17 0604 09/16/17 0908  BP:   96/65 106/69  Pulse:   (!) 56 (!) 52  Resp: 18  18 17   Temp: 97.9 F (36.6 C)  98.1 F (36.7 C) 98 F (36.7 C)  TempSrc: Oral  Oral Oral  SpO2: 96% 96% 96% 98%  Weight:      Height:          Intake/Output Summary (Last 24 hours) at 09/16/2017 1118 Last data filed at 09/16/2017 [id number] Gross per 24 hour  Intake 2822.92 ml  Output 2921 ml  Net -98.08 ml        Recent Labs    09/14/17 1015 09/16/17 0539  WBC 12.3* 16.1*  HGB 11.3* 9.1*  HCT 34.1* 26.5*  PLT 164 124*     Blood type: --/--/A POS (06/06 1015)  Rubella: Immune (11/21 0000)     Physical Exam:  General: alert, cooperative and no distress CV: Regular rate and rhythm Resp: clear Abdomen: soft, nontender, normal bowel sounds Incision: clean, dry and intact Uterine Fundus: firm, below umbilicus, nontender Lochia: minimal Ext: no edema, redness or tenderness in the calves or thighs      Assessment/Plan: 37 y.o.   POD# 1. [id number]                  Principal Problem:   Repeat C/S with BTL 6/7 Active Problems:   Postpartum care following cesarean delivery    Maternal anemia, with delivery  - start oral Fe and Mag Ox Doing well, stable.               Advance diet as tolerated Encourage rest when baby rests Breastfeeding support Encourage to ambulate Routine post-op care  Neta Mendsaniela C Sebastiana Wuest, CNM, MSN 09/16/2017, 11:18 AM

## 2017-09-16 NOTE — Addendum Note (Signed)
Addendum  created 09/16/17 0732 by Shanon PayorGregory, Nikan Ellingson M, CRNA   Sign clinical note

## 2017-09-16 NOTE — Lactation Note (Signed)
This note was copied from a baby's chart. Lactation Consultation Note  Patient Name: Hannah [name]: 09/16/2017    P2 mother whose infant is now 2914 hours old.  Mother has a 36 year old who she did not breastfeed long due to milk supply declining (1st pregnancy was 28 week twins; one is deceased)  Mother's breasts are soft and non tender with large everted nipples.  Mother was getting ready to feed baby as I arrived.  Latched baby onto the right breast in the football hold without difficulty.  Lips were flanged and rhythmic sucking noted.  Audible swallows were heard.  Taught mother breast compressions during feeds.  Infant required stimulation to continue sucking effectively.  After 15 minutes infant was burped and put to the left breast in the football hold without difficulty.    Encouraged mother to feed 8-12 times/24 hours or earlier if she shows feeding cues.  Continue STS, breast massage and hand expression.  Mother will finger feed any EBM she obtains.  Infant swaddled and put in bassinet after feeding.  Mother will call for assistance as needed.    Mom made aware of O/P services, breastfeeding support groups, community resources, and our phone # for post-discharge questions. Father present and supportive.   Maternal Data    Feeding Feeding Type: Breast Fed  LATCH Score Latch: Grasps breast easily, tongue down, lips flanged, rhythmical sucking.  Audible Swallowing: A few with stimulation  Type of Nipple: Everted at rest and after stimulation  Comfort (Breast/Nipple): Soft / non-tender  Hold (Positioning): Assistance needed to correctly position infant at breast and maintain latch.  LATCH Score: 8  Interventions    Lactation Tools Discussed/Used     Consult Status      Andreea Arca R Lillis Nuttle 09/16/2017, 3:57 AM

## 2017-09-16 NOTE — Progress Notes (Signed)
Mom requested foley to stay in until 0800 to get some rest and not have to worry about getting up to urinate.

## 2017-09-16 NOTE — Lactation Note (Signed)
This note was copied from a baby's chart. Lactation Consultation Note  Patient Name: Hannah [name] LOVFI'EToday's Date: 09/16/2017 Reason for consult: Follow-up assessment;Difficult latch   Maternal Data Formula Feeding for Exclusion: No Has patient been taught Hand Expression?: Yes Does the patient have breastfeeding experience prior to this delivery?: Yes  Feeding Feeding Type: Breast Fed Length of feed: 10 min  LATCH Score Latch: Repeated attempts needed to sustain latch, nipple held in mouth throughout feeding, stimulation needed to elicit sucking reflex.  Audible Swallowing: A few with stimulation  Type of Nipple: Everted at rest and after stimulation  Comfort (Breast/Nipple): Soft / non-tender  Hold (Positioning): Assistance needed to correctly position infant at breast and maintain latch.  LATCH Score: 7  Interventions Interventions: Breast feeding basics reviewed;Assisted with latch;Skin to skin;Breast massage;Position options;Support pillows;Breast compression;Comfort gels  Lactation Tools Discussed/Used     Consult Status Consult Status: Follow-up Date: 09/17/17 Follow-up type: In-patient    Dora SimsBeth R Lacharles Altschuler 09/16/2017, 10:47 PM

## 2017-09-16 NOTE — Lactation Note (Signed)
This note was copied from a baby's chart. Lactation Consultation Note  Patient Name: Hannah [name] LKGMW'NToday's Date: 09/16/2017 Reason for consult: Follow-up assessment  5529 hours old early term female who is being exclusively BF by her mother, she's a P2 but not very experienced BF. She exclusively pumped and bottle for 3 weeks when she had her twins in NICU 4 years ago, only one survived. She already knows how to hand express and has a Medela DEBP waiting for her at home, she ordered it through her insurance company.  Mom was trying to latch baby on when entering the room, offered assistance with latch and mom agreed, but she did not let LC take baby's clothes off to do STS, she did mention she'll try to do so in posterior feedings. LC took baby to mom's left breast in cross cradle position and baby was able to latch on right away. Visitors were present and the TV was on at the time of consult, it was hard to listen for swallows but noted baby's rhythmical sucking pattern and jaw movement. Baby self released from the breast at 5 minutes, mom proceeded to burp her and LC assisted with latch to the other breast, also in cross cradle position, but mom voiced that her right nipple was sore. Treatment for sore nipples was reviewed. Baby still nursing when exiting the room.  Encouraged mom to start feeding baby STS at least 8-12 times/24 hours or sooner if feeding cues are present. If baby is not cueing in a 3 hour period mom will wake her up to feed, placing her STS to the breast. She's aware of LC services and will call PRN.  Maternal Data    Feeding Feeding Type: Breast Fed Length of feed: 7 min(baby still nursing when exiting the room)  LATCH Score Latch: Grasps breast easily, tongue down, lips flanged, rhythmical sucking.  Audible Swallowing: A few with stimulation  Type of Nipple: Everted at rest and after stimulation  Comfort (Breast/Nipple): Filling, red/small blisters or bruises,  mild/mod discomfort  Hold (Positioning): Assistance needed to correctly position infant at breast and maintain latch.  LATCH Score: 7  Interventions Interventions: Breast feeding basics reviewed;Assisted with latch;Skin to skin;Breast massage;Breast compression;Adjust position;Support pillows;Position options  Lactation Tools Discussed/Used     Consult Status Consult Status: Follow-up Date: 09/17/17 Follow-up type: In-patient    Toiya Morrish Venetia ConstableS Deniya Craigo 09/16/2017, 6:49 PM

## 2017-09-17 ENCOUNTER — Encounter (HOSPITAL_COMMUNITY): Payer: Self-pay | Admitting: *Deleted

## 2017-09-17 MED ORDER — HYDROCORTISONE 1 % EX CREA
TOPICAL_CREAM | Freq: Four times a day (QID) | CUTANEOUS | Status: DC
  Administered 2017-09-17: 17:00:00 via TOPICAL
  Filled 2017-09-17: qty 28

## 2017-09-17 NOTE — Lactation Note (Signed)
This note was copied from a baby's chart. Lactation Consultation Note; infant is 7248 hours old. Infant has lost 8% weight. Mother reports that infant is feeding well. She has bilateral nipple tenderness. MD order to supplement infant with 10-15 ml. Mother was assisted with latching infant . Infant sustained latch for 30 mins on the Rt breast. Observed good burst of suckling and swallows. Mother taught breast compression. Infant latched to Lt breast for 10 mins. Assist mother with hand expression. Infant was given 4 ml of ebm with a gloved finger and a curved tip syringe.  Mother was sat up with a DEBP and assist with pumping. She was fit with a #27 flange. Staff nurse to assist with giving ebm/formula using a #5 fr feeding tube at the breast. Mother advised to hand express and pump after each feeding for 15 mins. Mother receptive to plan of care for breastfeeding and supplementing. Mother was given comfort gels. Advised to page [facility] for assistance as needed.   Patient Name: Hannah [name] Reason for consult: Follow-up assessment   Maternal Data    Feeding Feeding Type: Breast Milk Length of feed: 10 min  LATCH Score Latch: Grasps breast easily, tongue down, lips flanged, rhythmical sucking.  Audible Swallowing: Spontaneous and intermittent  Type of Nipple: Everted at rest and after stimulation  Comfort (Breast/Nipple): Filling, red/small blisters or bruises, mild/mod discomfort(pink and tender)  Hold (Positioning): Assistance needed to correctly position infant at breast and maintain latch.  LATCH Score: 8  Interventions Interventions: Hand pump;DEBP  Lactation Tools Discussed/Used Pump Review: Setup, frequency, and cleaning;Milk Storage Initiated by:: Shawnte Demarest RN,IBCLC Date initiated:: 09/17/17   Consult Status Consult Status: Follow-up Date: 09/17/17 Follow-up type: In-patient    Stevan BornKendrick, Disha Cottam [facility] [name], 3:03 PM

## 2017-09-17 NOTE — Progress Notes (Signed)
Subjective: POD# 2 Information for the patient's newborn:  Charlesetta IvoryLepkowski, Girl Emya [[id number]][[id number]]  female  Baby name: [name]  Reports feeling overwhelmed and emotional. Breastfeeding is demanding and baby weight loss 8%. Previous baby she pumped and fed 3 wks for 28 wks premie in NICU, (2nd twin did not survive) Baby latching to breast well, clusterfeeding. Desires to breastfeed exclusively and does not want to pump and feed like last time. Patient reports tolerating PO.  Breast symptoms: + colostrum Pain controlled with PO meds Denies HA/SOB/C/P/N/V/dizziness. Flatus present. She reports vaginal bleeding as normal, without clots.  She is ambulating, urinating without difficulty.     Objective:   VS:    Vitals:   09/16/17 0908 09/16/17 1419 09/16/17 1700 09/17/17 0508  BP: 106/69 99/70 105/64 120/79  Pulse: (!) 52 (!) 56 68 62  Resp: 17 16 18 18   Temp: 98 F (36.7 C) 97.9 F (36.6 C) 98.3 F (36.8 C) 98 F (36.7 C)  TempSrc: Oral Oral Oral Oral  SpO2: 98% 98% 99%   Weight:      Height:          Intake/Output Summary (Last 24 hours) at 09/17/2017 0939 Last data filed at 09/16/2017 1215 Gross per 24 hour  Intake -  Output 1300 ml  Net -1300 ml        Recent Labs    09/14/17 1015 09/16/17 0539  WBC 12.3* 16.1*  HGB 11.3* 9.1*  HCT 34.1* 26.5*  PLT 164 124*     Blood type: --/--/A POS (06/06 1015)  Rubella: Immune (11/21 0000)     Physical Exam:  General: alert, cooperative and mild distress Abdomen: soft, nontender, normal bowel sounds Incision: clean, dry and intact Uterine Fundus: firm, below umbilicus, nontender Lochia: minimal Ext: no edema, redness or tenderness in the calves or thighs      Assessment/Plan: 37 y.o.   POD# 2. [id number]                  Principal Problem:   Repeat C/S with BTL 6/7 Active Problems:   Postpartum care following cesarean delivery    Maternal anemia, with delivery  - started oral Fe and Mag Ox  Doing well, stable.     Encourage rest when baby rests Breastfeeding support, supplement as needed Routine post-op care May DC later today if feels more confident about breastfeeding  Neta Mendsaniela C Paul, CNM, MSN 09/17/2017, 9:39 AM

## 2017-09-18 MED ORDER — OXYCODONE HCL 5 MG PO TABS: 5.0000 mg | ORAL_TABLET | ORAL | 0 refills | Status: DC | PRN

## 2017-09-18 MED ORDER — IBUPROFEN 600 MG PO TABS: 600.0000 mg | ORAL_TABLET | Freq: Four times a day (QID) | ORAL | 0 refills | Status: DC

## 2017-09-18 NOTE — Discharge Summary (Signed)
Obstetric Discharge Summary   Patient Name: Hannah [name] DOB: 12/09/1980 MRN: [id number]  Date of Admission: 09/15/2017 Date of Discharge: 09/18/2017 Date of Delivery: 09/15/17 Gestational Age at Delivery: 2474w1d  Primary OB: [facility] - Dr. Juliene PinaMody   Antepartum complications:  - preterm labor and incompetent cervix, cerclage in the pregnancy at 16 wks (per pt's choice) and Makena progesterone injections from 16 to 36 wks. - Prior low vertical c/s at 28 weeks for PTL/twins - one twin passed shortly after birth - Cercalge was removed at 37 wks in office - AMA- normal NIPS - Anxiety due to past preg events   Prenatal Labs:  ABO, Rh: --/--/A POS (06/06 1015) Antibody: NEG (06/06 1015) Rubella: Immune (11/21 0000) RPR: Non Reactive (06/06 1015)  HBsAg: Negative (11/21 0000)  HIV: Non-reactive (11/21 0000)  GBS:   neg GLucola normal  Admitting Diagnosis: 38 weeks repeat CS and tubal ligation by salpingectomy   Secondary Diagnoses: Patient Active Problem List   Diagnosis Date Noted  . Maternal anemia, with delivery 09/16/2017  . Repeat C/S with BTL 6/7 09/15/2017  . History of preterm delivery, currently pregnant, second trimester 04/14/2017  . Preventative health care 05/15/2015  . Postpartum care following cesarean delivery  08/31/2013    Date of Delivery: 09/15/17 Delivered By: Dr. Juliene PinaMody Delivery Type: repeat cesarean section, low vertical incision  Newborn Data: Live born female  Birth Weight: 6 lb 13.5 oz (3105 g) APGAR: 8, 9  Newborn Delivery   Birth date/time:  09/15/2017 13:40:00 Delivery type:  C-Section, Low Vertical Trial of labor:  No C-section categorization:  Repeat     Hospital/Postpartum Course  (Cesarean Section):  Pt. Admitted for repeat c/s and desired sterilization.  See notes for details. Patient had an uncomplicated postpartum course.  By time of discharge on POD#3, her pain was controlled on oral pain medications; she had appropriate lochia and  was ambulating, voiding without difficulty, tolerating regular diet and passing flatus.   She was deemed stable for discharge to home.     Labs: CBC Latest Ref Rng & Units 09/16/2017 09/14/2017 04/14/2017  WBC 4.0 - 10.5 K/uL 16.1(H) 12.3(H) 11.5(H)  Hemoglobin 12.0 - 15.0 g/dL 0.8(M9.1(L) 11.3(L) 12.4  Hematocrit 36.0 - 46.0 % 26.5(L) 34.1(L) 36.5  Platelets 150 - 400 K/uL 124(L) 164 217   A POS  Physical exam:  BP (!) 145/83 (BP Location: Left Arm)   Pulse 67   Temp 98.2 F (36.8 C) (Oral)   Resp 18   Ht 5\' 5"  (1.651 m)   Wt 82.1 kg (181 lb)   LMP 10/20/2016   SpO2 98%   Breastfeeding? Unknown   BMI 30.12 kg/m  General: alert and no distress Pulm: normal respiratory effort Lochia: appropriate Abdomen: soft, NT Uterine Fundus: firm, below umbilicus Perineum: healing well, no significant erythema, no significant edema Incision: c/d/i, healing well, no significant drainage, no dehiscence, no significant erythema Extremities: No evidence of DVT seen on physical exam. No lower extremity edema.   Disposition: stable, discharge to home Baby Feeding: breast milk and formula Baby Disposition: home with mom  Contraception: s/p bilateral salpingectomy   Rh Immune globulin given: N/A Rubella vaccine given: N/A Tdap vaccine given in AP or PP setting: UTD Flu vaccine given in AP or PP setting: UTD   Plan:  Hannah Heapnna L Ong was discharged to home in good condition. [facility] weeks.  Discharge Instructions: Per After Visit Summary. Refer to After Visit Summary and  [facility] discharge booklet  Activity: Advance as tolerated. Pelvic rest for 6 weeks.   Diet: Regular, Heart Healthy Discharge Medications: Allergies as of 09/18/2017      Reactions   Tetracyclines & Related Other (See Comments)   Severe stomach cramps   Sulfa Antibiotics Rash   After sun exposure      Medication List    TAKE these medications   docusate sodium 100 MG  capsule Commonly known as:  COLACE Take 100 mg by mouth daily as needed for mild constipation.   ferrous sulfate 325 (65 FE) MG EC tablet Take 325 mg by mouth every other day.   FIBER-CAPS PO Take 2 capsules by mouth daily.   hydrocortisone 25 MG suppository Commonly known as:  ANUCORT-HC UNWRAP AND INSERT 1 SUPPOSITORY(25 MG) RECTALLY TWICE DAILY AS NEEDED FOR HEMORRHOIDS OR ITCHING   ibuprofen 600 MG tablet Commonly known as:  ADVIL,MOTRIN Take 1 tablet (600 mg total) by mouth every 6 (six) hours.   Magnesium 250 MG Tabs Take 250 mg by mouth at bedtime.   oxyCODONE 5 MG immediate release tablet Commonly known as:  Oxy IR/ROXICODONE Take 1 tablet (5 mg total) by mouth every 4 (four) hours as needed (pain scale 4-7).   prenatal multivitamin Tabs tablet Take 1 tablet by mouth daily.      Outpatient follow up:  Follow-up Information    Shea Evans, MD. Schedule an appointment as soon as possible for a visit in 6 week(s).   Specialty:  Obstetrics and Gynecology Why:  Postpartum visit  Contact information: [address] [place] [address] [phone]           Signed:  Carlean Jews, MSN, CNM [facility]

## 2017-09-18 NOTE — Progress Notes (Signed)
POSTOPERATIVE DAY # 3 S/P Repeat LTCS with BTL by salpingectomy, baby girl "Hannah [name]"   S:         Reports feeling better today; reports she is happy she stayed overnight to work on breastfeeding.  She reports feeling more confident now.              Tolerating po intake / no nausea / no vomiting / + flatus / + several BM  Denies dizziness, SOB, or CP             Bleeding is light             Pain controlled with Motrin, Tylenol, and Oxycodone IR             Up ad lib / ambulatory/ voiding QS  Newborn breast feeding - going better today; doing some formula supplementation after feedings.  Praised for great latch while I was in the room.    O:  VS: BP (!) 145/83 (BP Location: Left Arm)   Pulse 67   Temp 98.2 F (36.8 C) (Oral)   Resp 18   Ht 5\' 5"  (1.651 m)   Wt 82.1 kg (181 lb)   LMP 10/20/2016   SpO2 98%   Breastfeeding? Unknown   BMI 30.12 kg/m    LABS:               Recent Labs    09/16/17 0539  WBC 16.1*  HGB 9.1*  PLT 124*               Bloodtype: --/--/A POS (06/06 1015)  Rubella: Immune (11/21 0000)                                                       Physical Exam:             Alert and Oriented X3  Lungs: Clear and unlabored  Heart: regular rate and rhythm / no murmurs  Abdomen: soft, non-tender, non-distended; active bowel sounds in all quadrants              Fundus: firm, non-tender, U-2             Dressing: honeycomb dsg with steri-strips c/d/i              Incision:  approximated with sutures / no erythema / no ecchymosis / no drainage  Perineum: intact  Lochia: small, no clots   Extremities: trace BLEedema, no calf pain or tenderness,   A:        POD # 3 S/P Repeat LTCS with BTL by salpingectomy             ABL Anemia - stable, asymptomatic   Hx. Of constipation   P:        Routine postoperative care              Pt. To continue home iron supplement daily with Magnesium and fiber supplement   Discharge home today  [facility] discharge book, instructions,  and warning s/s reviewed   F/u with Dr. Juliene [name] in 6 weeks   Hannah JewsMeredith Sander Remedios, MSN, CNM [facility]

## 2017-09-18 NOTE — Lactation Note (Signed)
This note was copied from a baby's chart. Lactation Consultation Note  Patient Name: Girl Nehemiah Settlenna Pharr RUEAV'WToday's Date: 09/18/2017 Reason for consult: Follow-up assessment Baby is 3667 hours old and stable weight loss at 8%.  Mom reports baby is latching well but does get sleepy at breast.  She is post pumping and obtaining 5 mls of transitional milk.  Mom is supplementing expressed milk and formula per syringe feeding.  Questions answered.  Mom and baby have follow up tomorrow at pediatricians with lactation consultant.  Discussed hands on pumping and making a hands free bra at home.  Lactation outpatient services and support information reviewed and encouraged prn.  Maternal Data    Feeding    LATCH Score                   Interventions    Lactation Tools Discussed/Used     Consult Status Consult Status: Complete Follow-up type: Call as needed    Huston FoleyMOULDEN, Everlean Bucher S 09/18/2017, 9:10 AM

## 2018-04-24 ENCOUNTER — Ambulatory Visit: Payer: Self-pay

## 2018-04-24 NOTE — Telephone Encounter (Signed)
Patient called and says her daughter was diagnosed Flu positive today by her pediatrician and she was recommended to call and get a prescription for Tamilfu to prevent getting the flu. I advised I will send this to [facility] for review and someone will call with her recommendation.   Reason for Disposition . [1] Influenza EXPOSURE within last 48 hours (2 days) AND [2] exposed person is a Scientist, forensic, IT consultant, or first responder (EMS)  Answer Assessment - Initial Assessment Questions 1. TYPE of EXPOSURE: "How were you exposed?" (e.g., close contact, not a close contact)     Close contact from daughter 2. DATE of EXPOSURE: "When did the exposure occur?" (e.g., hour, days, weeks)     Diagnosed today 3. PREGNANCY: "Is there any chance you are pregnant?" "When was your last menstrual period?"     No 4. HIGH RISK for COMPLICATIONS: "Do you have any heart or lung problems? Do you have a weakened immune system?" (e.g., CHF, COPD, asthma, HIV positive, chemotherapy, renal failure, diabetes mellitus, sickle cell anemia)     No 5. SYMPTOMS: "Do you have any symptoms?" (e.g., cough, fever, sore throat, difficulty breathing).     No  Protocols used: INFLUENZA EXPOSURE-A-AH

## 2018-04-25 MED ORDER — OSELTAMIVIR PHOSPHATE 75 MG PO CAPS: 75.0000 mg | ORAL_CAPSULE | Freq: Every day | ORAL | 0 refills | Status: AC

## 2018-04-25 NOTE — Telephone Encounter (Signed)
Called pt, left message that rx has been sent to her pharmacy and to call if any questions/concerns.

## 2019-01-04 ENCOUNTER — Encounter: Payer: Self-pay | Admitting: Medical

## 2019-01-04 ENCOUNTER — Ambulatory Visit: Payer: BC Managed Care – PPO | Admitting: Medical

## 2019-01-04 ENCOUNTER — Other Ambulatory Visit: Payer: Self-pay

## 2019-01-04 VITALS — BP 134/89 | HR 93 | Temp 97.7°F | Resp 16 | Ht 65.0 in | Wt 163.8 lb

## 2019-01-04 DIAGNOSIS — H6122 Impacted cerumen, left ear: Secondary | ICD-10-CM

## 2019-01-04 DIAGNOSIS — R0981 Nasal congestion: Secondary | ICD-10-CM

## 2019-01-04 DIAGNOSIS — H9202 Otalgia, left ear: Secondary | ICD-10-CM | POA: Diagnosis not present

## 2019-01-04 DIAGNOSIS — N390 Urinary tract infection, site not specified: Secondary | ICD-10-CM

## 2019-01-04 DIAGNOSIS — R300 Dysuria: Secondary | ICD-10-CM

## 2019-01-04 LAB — POC URINALSYSI DIPSTICK (AUTOMATED)
Bilirubin, UA: NEGATIVE
Glucose, UA: NEGATIVE
Ketones, UA: NEGATIVE
Nitrite, UA: NEGATIVE
Protein, UA: NEGATIVE
Spec Grav, UA: <=1.005 — AB (ref 1.010–1.025)
Urobilinogen, UA: NEGATIVE E.U./dL — AB
pH, UA: 7 (ref 5.0–8.0)

## 2019-01-04 MED ORDER — CEPHALEXIN 500 MG PO CAPS: 500.0000 mg | ORAL_CAPSULE | Freq: Two times a day (BID) | ORAL | 0 refills | Status: DC

## 2019-01-04 MED ORDER — PHENAZOPYRIDINE HCL 200 MG PO TABS: 200.0000 mg | ORAL_TABLET | Freq: Three times a day (TID) | ORAL | 0 refills | Status: DC | PRN

## 2019-01-04 MED ORDER — FLUTICASONE PROPIONATE 50 MCG/ACT NA SUSP: 2.0000 | Freq: Every day | NASAL | 1 refills | Status: DC

## 2019-01-04 NOTE — Progress Notes (Signed)
Subjective:    Patient ID: Hannah [name], female    DOB: [birth year], 38 y.o.   MRN: [id number]  HPI  Patient with urgency, frequency, dysuria since last night. States her urine has been darker than usual, but no fowl odor. Reports some suprapubic pressure/discomfort. No flank pain.  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  No hx of chronic uti. LMP- 2-3 weeks ago.  Reports minor nasal congestion, post nasal drip, and left ear pressure for the past week. States she is managing well at home and does not need anything currently. Hx of allergies in [place].    Review of Systems  Constitutional: Negative for chills, fatigue and fever.  HENT: Positive for congestion, ear pain and postnasal drip. Negative for sinus pressure, sinus pain and sore throat.   Respiratory: Negative for chest tightness and shortness of breath.   Cardiovascular: Negative for chest pain.  Gastrointestinal: Positive for constipation. Negative for abdominal pain, diarrhea, nausea and vomiting.  Genitourinary: Positive for dysuria, frequency and urgency. Negative for flank pain and hematuria.       Suprapubic pressure  Musculoskeletal: Negative for back pain.  Neurological: Negative for dizziness, light-headedness and headaches.    Past Medical History:  Diagnosis Date  . History of cesarean delivery, currently pregnant 09/15/2017  . History of preterm delivery, currently pregnant, second trimester 04/14/2017  . History of preterm labor, current pregnancy 09/15/2017  . Hypertension   . Incompetent cervix in pregnancy, antepartum, second trimester 04/14/2017  . Medical history non-contributory      Social History   Socioeconomic History  . Marital status: Married    Spouse name: Not on file  . Number of children: Not on file  . Years of education: Not on file  . Highest education level: Not on file   Occupational History  . Not on file  Social Needs  . Financial resource strain: Not on file  . Food insecurity    Worry: Not on file    Inability: Not on file  . Transportation needs    Medical: Not on file    Non-medical: Not on file  Tobacco Use  . Smoking status: Never Smoker  . Smokeless tobacco: Never Used  Substance and Sexual Activity  . Alcohol use: Yes    Comment: occassioanl social drinker, not since pregnant  . Drug use: No  . Sexual activity: Not Currently  Lifestyle  . Physical activity    Days per week: Not on file    Minutes per session: Not on file  . Stress: Not on file  Relationships  . Social Herbalist on phone: Not on file    Gets together: Not on file    Attends religious service: Not on file    Active member of club or organization: Not on file    Attends meetings of clubs or organizations: Not on file    Relationship status: Not on file  . Intimate partner violence    Fear of current or ex partner: Not on file    Emotionally abused: Not on file    Physically abused: Not on file    Forced sexual activity: Not on file  Other Topics Concern  . 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 CERVICAL CERCLAGE N/A 04/14/2017   Procedure: CERCLAGE CERVICAL;  Surgeon: Shea Evans, MD;  Location: [facility];  Service: Gynecology;  Laterality: N/A;  EDD: 09/28/17  . CESAREAN SECTION N/A 08/31/2013   Procedure: CESAREAN SECTION;  Surgeon: Serita Kyle, MD;  Location: [facility];  Service: Obstetrics;  Laterality: N/A;  . CESAREAN SECTION WITH BILATERAL TUBAL LIGATION Bilateral 09/15/2017   Procedure: Repeat CESAREAN SECTION WITH BILATERAL TUBAL LIGATION;  Surgeon: Shea Evans, MD;  Location: [facility];  Service:  Obstetrics;  Laterality: Bilateral;  EDD: 09/28/17 Allergy: Tetracycline RNFA- Tracey  . MANDIBLE FRACTURE SURGERY Bilateral 2000  . NO PAST SURGERIES      Family History  Problem Relation Age of Onset  . Alcohol abuse Father   . Arthritis Father   . Hyperlipidemia Father   . Hypertension Father   . Diabetes Maternal Aunt   . Diabetes Maternal Grandmother   . Depression Paternal Aunt   . Depression Paternal Uncle   . Kidney disease Mother     Allergies  Allergen Reactions  . Tetracyclines & Related Other (See Comments)    Severe stomach cramps  . Sulfa Antibiotics Rash    After sun exposure    Current Outpatient Medications on File Prior to Visit  Medication Sig Dispense Refill  . Multiple Vitamin (MULTIVITAMIN) capsule Take 1 capsule by mouth daily.     No current facility-administered medications on file prior to visit.     BP 134/89   Pulse 93   Temp 97.7 F (36.5 C) (Temporal)   Resp 16   Ht 5\' 5"  (1.651 m)   Wt 163 lb 12.8 oz (74.3 kg)   SpO2 100%   BMI 27.26 kg/m       Objective:   Physical Exam   General  Mental Status - Alert. General Appearance - Well groomed. Not in acute distress.  Skin Rashes- No Rashes.  HEENT Head- Normal. Ear Auditory Canal - Left- Normal. Right - Normal.Tympanic Membrane- Left- Normal. Right- Normal. Eye Sclera/Conjunctiva- Left- Normal. Right- Normal. Nose & Sinuses Nasal Mucosa- Left-  Boggy and Congested. Right-  Boggy and  Congested.Bilateral no maxillary and no  frontal sinus pressure. Mouth & Throat  Lips: Upper Lip- Normal: no dryness, cracking, pallor, cyanosis, or vesicular eruption. Lower Lip-Normal: no dryness, cracking, pallor, cyanosis or vesicular eruption. Buccal Mucosa- Bilateral- No Aphthous ulcers. Oropharynx- No Discharge or Erythema. Tonsils: Characteristics- Bilateral- No Erythema or Congestion. Size/Enlargement- Bilateral- No enlargement. Discharge- bilateral-None.  Neck Neck- Supple. No  Masses.   Chest and Lung Exam Auscultation: Breath Sounds:-Clear even and unlabored.  Cardiovascular Auscultation:Rythm- Regular, rate and rhythm. Murmurs & Other Heart Sounds:Ausculatation of the heart reveal- No Murmurs.  Lymphatic Head & Neck General Head & Neck Lymphatics: Bilateral: Description- No Localized lymphadenopathy.      Assessment & Plan:  You appear to have possible early urinary tract infection. I am prescribing keflex antibiotic for the probable infection. Hydrate well. I am sending out a urine culture. During the interim if your signs and symptoms worsen rather than improving please notify . We will notify your when the culture results are back.  For one week nasal congestion and allergy hx, rx flonase. For ear pain keflex should help if ear infection. But also use debrox otc for wax. You may  need  irrigation in near future if symptoms persist.  Note did counsel pt on difficulty in evaluating pt with allergy type symptoms during the pandemic. After discussion recommended going ahead and getting covid tested. She declined so gave option to start flonase and other meds. If symptoms persist contact me  by my chart and notify me. Then can place the order in epic so she can go through the drive through center.  Follow up in 7 days or as needed.  25+ minutes spent with pt today. 50% of time spent counseling on plan going forward,    Whole FoodsEdward Mayerli Kirst, VF CorporationPA-C

## 2019-01-04 NOTE — Patient Instructions (Addendum)
You appear to have possible early urinary tract infection. I am prescribing keflex antibiotic for the probable infection. Hydrate well. I am sending out a urine culture. During the interim if your signs and symptoms worsen rather than improving please notify us. We will notify your when the culture results are back.  For one week nasal congestion and allergy hx, rx flonase. For ear pain keflex should help if ear infection. But also use debrox otc for wax. You may need irrigation in near future if symptoms persist.  Note did counsel pt on difficulty in evaluating pt with allergy type symptoms during the pandemic. After discussion recommended going ahead and getting covid tested. She declined so gave option to start flonase and other meds. If symptoms persist contact me  by my chart and notify me. Then can place the order in epic so she can go through the drive through center.  Follow up in 7 days or as needed.

## 2019-01-06 LAB — URINE CULTURE
MICRO NUMBER:: 923414
SPECIMEN QUALITY:: ADEQUATE

## 2019-01-26 ENCOUNTER — Other Ambulatory Visit: Payer: Self-pay | Admitting: Medical

## 2019-01-28 NOTE — Telephone Encounter (Signed)
flonase refill sent. Pt is due for cpx, please contact pt to schedule.

## 2019-01-29 NOTE — Telephone Encounter (Signed)
Called pat left vm to call back and schedule CPE

## 2019-07-29 ENCOUNTER — Other Ambulatory Visit: Payer: Self-pay

## 2019-07-29 ENCOUNTER — Encounter: Payer: Self-pay | Admitting: Family

## 2019-07-29 MED ORDER — HYDROCORT-PRAMOXINE (PERIANAL) 1-1 % EX FOAM: 1.0000 | Freq: Two times a day (BID) | CUTANEOUS | 1 refills | Status: AC

## 2019-07-31 ENCOUNTER — Ambulatory Visit: Payer: BC Managed Care – PPO | Admitting: Family

## 2019-08-07 ENCOUNTER — Encounter: Payer: BC Managed Care – PPO | Admitting: Family

## 2019-08-28 ENCOUNTER — Other Ambulatory Visit: Payer: Self-pay

## 2019-08-28 ENCOUNTER — Encounter: Payer: Self-pay | Admitting: Family Medicine

## 2019-08-28 ENCOUNTER — Telehealth (INDEPENDENT_AMBULATORY_CARE_PROVIDER_SITE_OTHER): Payer: BC Managed Care – PPO | Admitting: Family Medicine

## 2019-08-28 DIAGNOSIS — K649 Unspecified hemorrhoids: Secondary | ICD-10-CM

## 2019-08-28 MED ORDER — HYDROCORTISONE ACETATE 25 MG RE SUPP: 25.0000 mg | Freq: Two times a day (BID) | RECTAL | 0 refills | Status: DC

## 2019-08-28 NOTE — Progress Notes (Signed)
Virtual Visit via Video Note  I connected with Hannah [name] on 08/28/19 at 11:20 AM EDT by a video enabled telemedicine application and verified that I am speaking with the correct person using two identifiers.  Location: Patient: home alone  Provider: home    I discussed the limitations of evaluation and management by telemedicine and the availability of in person appointments. The patient expressed understanding and agreed to proceed.  History of Present Illness: Pt is home c/o hemorrhoids--- she used the foam sent in but it did not work.  She usually needs the suppositories  She is inc her fiber , using sitz baths  No other complaints    Past Medical History:  Diagnosis Date  . History of cesarean delivery, currently pregnant 09/15/2017  . History of preterm delivery, currently pregnant, second trimester 04/14/2017  . History of preterm labor, current pregnancy 09/15/2017  . Hypertension   . Incompetent cervix in pregnancy, antepartum, second trimester 04/14/2017  . Medical history non-contributory    Current Outpatient Medications on File Prior to Visit  Medication Sig Dispense Refill  . cephALEXin (KEFLEX) 500 MG capsule Take 1 capsule (500 mg total) by mouth 2 (two) times daily. 20 capsule 0  . fluticasone (FLONASE) 50 MCG/ACT nasal spray SPRAY 2 SPRAYS INTO EACH NOSTRIL EVERY DAY 16 mL 1  . hydrocortisone-pramoxine (PROCTOFOAM-HC) rectal foam Place 1 applicator rectally 2 (two) times daily. 10 g 1  . Multiple Vitamin (MULTIVITAMIN) capsule Take 1 capsule by mouth daily.    . phenazopyridine (PYRIDIUM) 200 MG tablet Take 1 tablet (200 mg total) by mouth 3 (three) times daily as needed for pain. 10 tablet 0   No current facility-administered medications on file prior to visit.   Allergies  Allergen Reactions  . Tetracyclines & Related Other (See Comments)    Severe stomach cramps  . Sulfa Antibiotics Rash    After sun exposure   Family History  Problem Relation Age of Onset   . Alcohol abuse Father   . Arthritis Father   . Hyperlipidemia Father   . Hypertension Father   . Diabetes Maternal Aunt   . Diabetes Maternal Grandmother   . Depression Paternal Aunt   . Depression Paternal Uncle   . Kidney disease Mother     Observations/Objective: There were no vitals filed for this visit. Pt is in nad   Assessment and Plan: 1. Hemorrhoids, unspecified hemorrhoid type con't fiber and sitz bathes supp sent in  F/u with pcp in office if no better  Med rec reviewed  - hydrocortisone (ANUSOL-HC) 25 MG suppository; Place 1 suppository (25 mg total) rectally 2 (two) times daily.  Dispense: 24 suppository; Refill: 0   Follow Up Instructions:    I discussed the assessment and treatment plan with the patient. The patient was provided an opportunity to ask questions and all were answered. The patient agreed with the plan and demonstrated an understanding of the instructions.   The patient was advised to call back or seek an in-person evaluation if the symptoms worsen or if the condition fails to improve as anticipated.  I provided 25 minutes of non-face-to-face time during this encounter.   Donato Schultz, DO

## 2019-09-23 ENCOUNTER — Ambulatory Visit: Payer: BC Managed Care – PPO | Admitting: Family

## 2019-10-01 ENCOUNTER — Encounter: Payer: BC Managed Care – PPO | Admitting: Family

## 2019-10-29 ENCOUNTER — Encounter: Payer: BC Managed Care – PPO | Admitting: Family

## 2020-01-02 ENCOUNTER — Telehealth: Payer: Self-pay | Admitting: Family

## 2020-01-02 MED ORDER — PROCTOFOAM HC 1-1 % EX FOAM: 1.0000 | Freq: Two times a day (BID) | CUTANEOUS | 0 refills | Status: DC

## 2020-01-02 NOTE — Telephone Encounter (Signed)
Caller name: Shayona Call back number: [phone]  Patient states she is having hemorrhoid problems again, she is wondering if you could send a prescription to [id number] - [place], [place] - [address] AT [address]  [address], [place] [address]  Phone:  [phone] Fax:  [phone]  DEA #:

## 2020-01-02 NOTE — Telephone Encounter (Signed)
Rx  sent for proctofoam.

## 2020-01-02 NOTE — Telephone Encounter (Signed)
Please advise 

## 2020-01-03 NOTE — Telephone Encounter (Signed)
Patient advised rx was sent. ?

## 2020-01-10 ENCOUNTER — Ambulatory Visit (INDEPENDENT_AMBULATORY_CARE_PROVIDER_SITE_OTHER): Payer: BC Managed Care – PPO | Admitting: Family

## 2020-01-10 ENCOUNTER — Encounter: Payer: Self-pay | Admitting: Family

## 2020-01-10 ENCOUNTER — Other Ambulatory Visit: Payer: Self-pay

## 2020-01-10 VITALS — BP 153/89 | HR 100 | Temp 98.6°F | Resp 16 | Ht 65.0 in | Wt 161.0 lb

## 2020-01-10 DIAGNOSIS — Z8719 Personal history of other diseases of the digestive system: Secondary | ICD-10-CM | POA: Diagnosis not present

## 2020-01-10 DIAGNOSIS — R030 Elevated blood-pressure reading, without diagnosis of hypertension: Secondary | ICD-10-CM

## 2020-01-10 NOTE — Patient Instructions (Addendum)
Try epsom salt soaks as needed.  Continue proctofoam as needed. Continue a high fiber diet and plenty of water.  Check blood pressure once daily at home for the next few days and send me your readings via mychart.

## 2020-01-10 NOTE — Progress Notes (Signed)
Subjective:    Patient ID: Hannah [name], female    DOB: 02/13/1981, 39 y.o.   MRN: [id number]  HPI  Reports that she is trying to exercise more and improve her diet.  Proctofoam helps with the itching. She is using fiber supplements.   BP Readings from Last 3 Encounters:  01/10/20 (!) 153/89  01/04/19 134/89  09/18/17 (!) 145/83      Review of Systems  See HPI     Past Medical History:  Diagnosis Date  . History of cesarean delivery, currently pregnant 09/15/2017  . History of preterm delivery, currently pregnant, second trimester 04/14/2017  . History of preterm labor, current pregnancy 09/15/2017  . Hypertension   . Incompetent cervix in pregnancy, antepartum, second trimester 04/14/2017  . Medical history non-contributory      Social History   Socioeconomic History  . Marital status: Married    Spouse name: Not on file  . Number of children: Not on file  . Years of education: Not on file  . Highest education level: Not on file  Occupational History  . Not on file  Tobacco Use  . Smoking status: Never Smoker  . Smokeless tobacco: Never Used  Substance and Sexual Activity  . Alcohol use: Yes    Comment: occassioanl social drinker, not since pregnant  . Drug use: No  . Sexual activity: Not Currently  Other Topics Concern  . 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 Difficulty of Paying Living Expenses: Not on file  Food Insecurity:   . Worried About Programme researcher, broadcasting/film/video in the Last Year: Not on file  . Ran Out of Food in the Last Year: Not on file  Transportation Needs:   . Lack of Transportation (Medical): Not on file  . Lack of Transportation (Non-Medical): Not on file  Physical Activity:   . Days of Exercise per Week:  Not on file  . Minutes of Exercise per Session: Not on file  Stress:   . Feeling of Stress : Not on file  Social Connections:   . Frequency of Communication with Friends and Family: Not on file  . Frequency of Social Gatherings with Friends and Family: Not on file  . Attends Religious Services: Not on file  . Active Member of Clubs or Organizations: Not on file  . Attends Banker Meetings: Not on file  . Marital Status: Not on file  Intimate Partner Violence:   . Fear of Current or Ex-Partner: Not on file  . Emotionally Abused: Not on file  . Physically Abused: Not on file  . Sexually Abused: Not on file    Past Surgical History:  Procedure Laterality Date  . CERVICAL CERCLAGE N/A 04/14/2017   Procedure: CERCLAGE CERVICAL;  Surgeon: Shea Evans, MD;  Location: [facility];  Service: Gynecology;  Laterality: N/A;  EDD: 09/28/17  . CESAREAN SECTION N/A 08/31/2013   Procedure: CESAREAN SECTION;  Surgeon: Serita Kyle, MD;  Location: [facility];  Service: Obstetrics;  Laterality: N/A;  . CESAREAN SECTION WITH BILATERAL TUBAL LIGATION Bilateral 09/15/2017   Procedure: Repeat CESAREAN SECTION WITH BILATERAL TUBAL LIGATION;  Surgeon: Shea Evans, MD;  Location: [facility];  Service: Obstetrics;  Laterality: Bilateral;  EDD: 09/28/17 Allergy: Tetracycline RNFA- Tracey  . MANDIBLE FRACTURE SURGERY Bilateral 2000  . NO PAST SURGERIES      Family History  Problem Relation Age of Onset  . Alcohol abuse Father   . Arthritis Father   . Hyperlipidemia Father   . Hypertension Father   . Diabetes Maternal Aunt   . Diabetes Maternal Grandmother   . Depression Paternal Aunt   . Depression Paternal Uncle   . Kidney disease Mother     Allergies  Allergen Reactions  . Tetracyclines & Related Other (See Comments)    Severe stomach cramps  . Sulfa Antibiotics Rash    After sun exposure    Current Outpatient Medications on File Prior to Visit  Medication Sig Dispense  Refill  . hydrocortisone-pramoxine (PROCTOFOAM HC) rectal foam Place 1 applicator rectally 2 (two) times daily for 10 days. 5.5 g 0  . hydrocortisone-pramoxine (PROCTOFOAM-HC) rectal foam Place 1 applicator rectally 2 (two) times daily. 10 g 1  . Multiple Vitamin (MULTIVITAMIN) capsule Take 1 capsule by mouth daily.     No current facility-administered medications on file prior to visit.    BP (!) 153/89 (BP Location: Right Arm, Patient Position: Sitting, Cuff Size: Small)   Pulse 100   Temp 98.6 F (37 C) (Oral)   Resp 16   Ht 5\' 5"  (1.651 m)   Wt 161 lb (73 kg)   SpO2 100%   BMI 26.79 kg/m    Objective:   Physical Exam Constitutional:      Appearance: She is well-developed.  Neck:     Thyroid: No thyromegaly.  Cardiovascular:     Rate and Rhythm: Normal rate and regular rhythm.     Heart sounds: Normal heart sounds. No murmur heard.   Pulmonary:     Effort: Pulmonary effort is normal. No respiratory distress.     Breath sounds: Normal breath sounds. No wheezing.  Genitourinary:    Rectum: Normal. No mass or anal fissure. Normal anal tone.     Comments: I am unable to palpate any internal hemorrhoids.  There is some redundant tissue near anus but no definite hemorrhoid at this time. No rashes.  Musculoskeletal:     Cervical back: Neck supple.  Skin:    General: Skin is warm and dry.  Neurological:     Mental Status: She is alert and oriented to person, place, and time.  Psychiatric:        Behavior: Behavior normal.        Thought Content: Thought content normal.        Judgment: Judgment normal.           Assessment & Plan:  Hx of hemorrhoids- Continue proctofoam prn, recommended prn sitz baths with epsom salts, high fiber diet and adequate hydration.  Pt verbalizes understanding.  Elevated blood pressure reading- repeat BP was 140/85.  She will check her bp at home once daily for a few days and send me her readings via mychart.  Will plan to bring her back  for in office recheck in 3 months  This visit occurred during the SARS-CoV-2 public health emergency.  Safety protocols were in place, including screening questions prior to the visit, additional usage of staff PPE, and extensive cleaning of exam room while observing appropriate contact time as indicated for disinfecting solutions.

## 2020-03-23 ENCOUNTER — Encounter: Payer: Self-pay | Admitting: Family

## 2020-03-24 MED ORDER — HYDROCORTISONE ACETATE 25 MG RE SUPP: 25.0000 mg | Freq: Two times a day (BID) | RECTAL | 2 refills | Status: AC | PRN
